# Patient Record
Sex: Female | Born: 1965 | Race: Black or African American | Hispanic: No | Marital: Married | Smoking: Never smoker
Health system: Southern US, Community
[De-identification: ages and names within clinical notes are randomized; demographics above are authoritative.]

## PROBLEM LIST (undated history)

## (undated) DIAGNOSIS — I10 Essential (primary) hypertension: Secondary | ICD-10-CM

## (undated) DIAGNOSIS — N62 Hypertrophy of breast: Secondary | ICD-10-CM

## (undated) DIAGNOSIS — E669 Obesity, unspecified: Secondary | ICD-10-CM

## (undated) DIAGNOSIS — R002 Palpitations: Secondary | ICD-10-CM

## (undated) DIAGNOSIS — L739 Follicular disorder, unspecified: Secondary | ICD-10-CM

## (undated) DIAGNOSIS — B369 Superficial mycosis, unspecified: Secondary | ICD-10-CM

## (undated) DIAGNOSIS — D649 Anemia, unspecified: Secondary | ICD-10-CM

## (undated) DIAGNOSIS — K219 Gastro-esophageal reflux disease without esophagitis: Secondary | ICD-10-CM

## (undated) HISTORY — DX: Obesity, unspecified: E66.9

## (undated) HISTORY — DX: Superficial mycosis, unspecified: B36.9

## (undated) HISTORY — DX: Essential (primary) hypertension: I10

## (undated) HISTORY — DX: Follicular disorder, unspecified: L73.9

## (undated) HISTORY — DX: Palpitations: R00.2

---

## 2002-01-09 ENCOUNTER — Other Ambulatory Visit: Admission: RE | Admit: 2002-01-09 | Discharge: 2002-01-09 | Payer: Self-pay | Admitting: Obstetrics and Gynecology

## 2003-12-19 ENCOUNTER — Emergency Department (HOSPITAL_COMMUNITY): Admission: EM | Admit: 2003-12-19 | Discharge: 2003-12-19 | Payer: Self-pay | Admitting: Emergency Medicine

## 2005-04-26 ENCOUNTER — Ambulatory Visit: Payer: Self-pay | Admitting: Family Medicine

## 2005-06-14 ENCOUNTER — Ambulatory Visit: Payer: Self-pay | Admitting: Family Medicine

## 2006-05-17 ENCOUNTER — Emergency Department (HOSPITAL_COMMUNITY): Admission: EM | Admit: 2006-05-17 | Discharge: 2006-05-17 | Payer: Self-pay | Admitting: Emergency Medicine

## 2007-05-08 ENCOUNTER — Ambulatory Visit (HOSPITAL_COMMUNITY): Admission: RE | Admit: 2007-05-08 | Discharge: 2007-05-08 | Payer: Self-pay | Admitting: Obstetrics and Gynecology

## 2007-10-17 ENCOUNTER — Ambulatory Visit: Payer: Self-pay | Admitting: Family Medicine

## 2007-10-18 ENCOUNTER — Encounter: Payer: Self-pay | Admitting: Family Medicine

## 2007-10-18 ENCOUNTER — Ambulatory Visit (HOSPITAL_COMMUNITY): Admission: RE | Admit: 2007-10-18 | Discharge: 2007-10-18 | Payer: Self-pay | Admitting: Family Medicine

## 2007-10-18 LAB — CONVERTED CEMR LAB
Alkaline Phosphatase: 70 units/L (ref 39–117)
Basophils Absolute: 0 10*3/uL (ref 0.0–0.1)
Bilirubin, Direct: 0.1 mg/dL (ref 0.0–0.3)
CO2: 28 meq/L (ref 19–32)
Chloride: 102 meq/L (ref 96–112)
Eosinophils Absolute: 0.1 10*3/uL — ABNORMAL LOW (ref 0.2–0.7)
HCT: 41 % (ref 36.0–46.0)
Hemoglobin: 13.5 g/dL (ref 12.0–15.0)
LDL Cholesterol: 101 mg/dL — ABNORMAL HIGH (ref 0–99)
Lymphs Abs: 1.5 10*3/uL (ref 0.7–4.0)
MCHC: 32.9 g/dL (ref 30.0–36.0)
Monocytes Absolute: 0.3 10*3/uL (ref 0.1–1.0)
Monocytes Relative: 6 % (ref 3–12)
Platelets: 238 10*3/uL (ref 150–400)
Potassium: 3.8 meq/L (ref 3.5–5.3)
RBC: 4.83 M/uL (ref 3.87–5.11)
Sodium: 142 meq/L (ref 135–145)
Triglycerides: 68 mg/dL (ref <150)
VLDL: 14 mg/dL (ref 0–40)
WBC: 5 10*3/uL (ref 4.0–10.5)

## 2007-10-21 ENCOUNTER — Ambulatory Visit (HOSPITAL_COMMUNITY): Admission: RE | Admit: 2007-10-21 | Discharge: 2007-10-21 | Payer: Self-pay | Admitting: Family Medicine

## 2009-04-11 ENCOUNTER — Ambulatory Visit: Payer: Self-pay | Admitting: Family Medicine

## 2009-04-11 DIAGNOSIS — I10 Essential (primary) hypertension: Secondary | ICD-10-CM

## 2009-04-11 DIAGNOSIS — R079 Chest pain, unspecified: Secondary | ICD-10-CM | POA: Insufficient documentation

## 2009-04-12 ENCOUNTER — Ambulatory Visit: Payer: Self-pay | Admitting: Cardiology

## 2009-04-18 ENCOUNTER — Ambulatory Visit (HOSPITAL_COMMUNITY): Admission: RE | Admit: 2009-04-18 | Discharge: 2009-04-18 | Payer: Self-pay | Admitting: Cardiology

## 2009-04-18 ENCOUNTER — Ambulatory Visit: Payer: Self-pay | Admitting: Cardiology

## 2009-04-18 ENCOUNTER — Encounter: Payer: Self-pay | Admitting: Cardiology

## 2009-05-02 ENCOUNTER — Telehealth (INDEPENDENT_AMBULATORY_CARE_PROVIDER_SITE_OTHER): Payer: Self-pay

## 2010-02-16 ENCOUNTER — Ambulatory Visit: Payer: Self-pay | Admitting: Family Medicine

## 2010-02-21 ENCOUNTER — Encounter: Payer: Self-pay | Admitting: Physician Assistant

## 2010-02-22 LAB — CONVERTED CEMR LAB
ALT: 20 units/L (ref 0–35)
Alkaline Phosphatase: 69 units/L (ref 39–117)
BUN: 16 mg/dL (ref 6–23)
CO2: 29 meq/L (ref 19–32)
Glucose, Bld: 82 mg/dL (ref 70–99)
HCT: 41.1 % (ref 36.0–46.0)
Hemoglobin: 13.6 g/dL (ref 12.0–15.0)
LDL Cholesterol: 116 mg/dL — ABNORMAL HIGH (ref 0–99)
MCHC: 33.1 g/dL (ref 30.0–36.0)
MCV: 84.6 fL (ref 78.0–100.0)
RDW: 12.2 % (ref 11.5–15.5)
TSH: 1.705 microintl units/mL (ref 0.350–4.500)
Total Bilirubin: 0.6 mg/dL (ref 0.3–1.2)
Total Protein: 7.4 g/dL (ref 6.0–8.3)
Triglycerides: 62 mg/dL (ref <150)
VLDL: 12 mg/dL (ref 0–40)
WBC: 4.8 10*3/uL (ref 4.0–10.5)

## 2010-03-21 ENCOUNTER — Ambulatory Visit: Payer: Self-pay | Admitting: Family Medicine

## 2010-03-21 DIAGNOSIS — E559 Vitamin D deficiency, unspecified: Secondary | ICD-10-CM

## 2010-03-21 DIAGNOSIS — R002 Palpitations: Secondary | ICD-10-CM | POA: Insufficient documentation

## 2010-05-25 ENCOUNTER — Encounter: Payer: Self-pay | Admitting: Family Medicine

## 2010-05-25 ENCOUNTER — Other Ambulatory Visit: Admission: RE | Admit: 2010-05-25 | Discharge: 2010-05-25 | Payer: Self-pay | Admitting: Obstetrics and Gynecology

## 2010-05-31 ENCOUNTER — Ambulatory Visit (HOSPITAL_COMMUNITY): Admission: RE | Admit: 2010-05-31 | Discharge: 2010-05-31 | Payer: Self-pay | Admitting: Obstetrics & Gynecology

## 2010-08-09 ENCOUNTER — Ambulatory Visit: Payer: Self-pay | Admitting: Family Medicine

## 2010-08-09 DIAGNOSIS — K3189 Other diseases of stomach and duodenum: Secondary | ICD-10-CM

## 2010-08-09 DIAGNOSIS — R1013 Epigastric pain: Secondary | ICD-10-CM

## 2010-12-17 ENCOUNTER — Encounter: Payer: Self-pay | Admitting: Family Medicine

## 2010-12-17 ENCOUNTER — Encounter: Payer: Self-pay | Admitting: Obstetrics and Gynecology

## 2010-12-26 NOTE — Progress Notes (Signed)
Summary: family tree  family tree   Imported By: Lind Guest 05/26/2010 14:33:48  _____________________________________________________________________  External Attachment:    Type:   Image     Comment:   External Document

## 2010-12-26 NOTE — Assessment & Plan Note (Signed)
Summary: office visit   Vital Signs:  Patient profile:   45 year old female Menstrual status:  regular Height:      66 inches Weight:      210.25 pounds BMI:     34.06 O2 Sat:      97 % Pulse rate:   96 / minute Pulse rhythm:   regular Resp:     16 per minute BP sitting:   144 / 100  (left arm) Cuff size:   large  Vitals Entered By: Everitt Amber LPN (August 09, 2010 3:41 PM)  Nutrition Counseling: Patient's BMI is greater than 25 and therefore counseled on weight management options. CC: Follow up chronic problems, stopped metoprolol and went back to norvasc   CC:  Follow up chronic problems and stopped metoprolol and went back to norvasc.  History of Present Illness: Reports  thatshe has not been doing well. She is concerned about her blood pressure being high, and also because of significant weight gain. She has not been exercising regulalrly, but intends to start. Denies recent fever or chills. Denies sinus pressure, nasal congestion , ear pain or sore throat. Denies chest congestion, or cough productive of sputum. Denies chest pain, palpitations, PND, orthopnea or leg swelling. Denies abdominal pain, nausea, vomitting, diarrhea or constipation. Denies change in bowel movements or bloody stool. Denies dysuria , frequency, incontinence or hesitancy. Denies  joint pain, swelling, or reduced mobility. Denies headaches, vertigo, seizures. Denies depression, anxiety or insomnia. Denies  rash, lesions, or itch.     Current Medications (verified): 1)  Hydrochlorothiazide 25 Mg Tabs (Hydrochlorothiazide) .... Take 1 Tablet By Mouth Once A Day 2)  Vitamin D (Ergocalciferol) 50000 Unit Caps (Ergocalciferol) .... Take 1 Weekly  Allergies (verified): No Known Drug Allergies  Review of Systems      See HPI General:  Complains of fatigue. Eyes:  Denies blurring and discharge. GI:  Complains of bloody stools; sttaes her gynae provider gave stool cards and intends to f/u, she  also reports dyspepsia. Endo:  Denies cold intolerance, excessive thirst, excessive urination, and heat intolerance. Heme:  Denies abnormal bruising and bleeding. Allergy:  Complains of seasonal allergies.  Physical Exam  General:  Well-developed,well-nourished,in no acute distress; alert,appropriate and cooperative throughout examination HEENT: No facial asymmetry,  EOMI, No sinus tenderness, TM's Clear, oropharynx  pink and moist.   Chest: Clear to auscultation bilaterally.  CVS: S1, S2, No murmurs, No S3.   Abd: Soft, Nontender.  MS: Adequate ROM spine, hips, shoulders and knees.  Ext: No edema.   CNS: CN 2-12 intact, power tone and sensation normal throughout.   Skin: Intact, no visible lesions or rashes.  Psych: Good eye contact, normal affect.  Memory intact, not anxious or depressed appearing.    Impression & Recommendations:  Problem # 1:  DYSPEPSIA (ICD-536.8) Assessment Deteriorated  Orders: TLB-H. Pylori Abs(Helicobacter Pylori) ([address][id number])  Problem # 2:  PALPITATIONS (ICD-785.1) Assessment: Improved  The following medications were removed from the medication list:    Metoprolol Succinate 50 Mg Xr24h-tab (Metoprolol succinate) .Marland Kitchen... Take 1 daily for high blood pressure  Problem # 3:  ESSENTIAL HYPERTENSION (ICD-401.9) Assessment: Deteriorated  The following medications were removed from the medication list:    Hydrochlorothiazide 25 Mg Tabs (Hydrochlorothiazide) .Marland Kitchen... Take 1 tablet by mouth once a day    Metoprolol Succinate 50 Mg Xr24h-tab (Metoprolol succinate) .Marland Kitchen... Take 1 daily for high blood pressure Her updated medication list for this problem includes:    Amlodipine Besylate 10  Mg Tabs (Amlodipine besylate) .Marland Kitchen... Take 1 tablet by mouth once a day    Triamterene-hctz 37.5-25 Mg Tabs (Triamterene-hctz) .Marland Kitchen... Take 1 tablet by mouth once a day  Orders: T-Basic Metabolic Panel [phone])  BP today: 144/100 Prior BP: 126/90 (03/21/2010)  Labs  Reviewed: K+: 3.7 (02/21/2010) Creat: : 0.83 (02/21/2010)   Chol: 187 (02/21/2010)   HDL: 59 (02/21/2010)   LDL: 116 (02/21/2010)   TG: 62 (02/21/2010)  Problem # 4:  OBESITY, UNSPECIFIED (ICD-278.00) Assessment: Deteriorated  Ht: 66 (08/09/2010)   Wt: 210.25 (08/09/2010)   BMI: 34.06 (08/09/2010) therapeutic lifestyle change discussed and encouraged  Problem # 5:  UNSPECIFIED VITAMIN D DEFICIENCY (ICD-268.9) Assessment: Comment Only  Orders: T-Vitamin D (25-Hydroxy) ([id number])  Complete Medication List: 1)  Vitamin D (ergocalciferol) 50000 Unit Caps (Ergocalciferol) .... Take 1 weekly 2)  Amlodipine Besylate 10 Mg Tabs (Amlodipine besylate) .... Take 1 tablet by mouth once a day 3)  Triamterene-hctz 37.5-25 Mg Tabs (Triamterene-hctz) .... Take 1 tablet by mouth once a day  Patient Instructions: 1)  F/U in 6 weeks. 2)  It is important that you exercise regularly at least 30 minutes 5 times a week. If you develop chest pain, have severe difficulty breathing, or feel very tired , stop exercising immediately and seek medical attention. 3)  You need to lose weight. Consider a lower calorie diet and regular exercise. We will you the DASH diet.Goal is 3 pounds. 4)  New meds for BP as discussed. 5)  Vit D level and aH pylori test today. 6)  I suggest youn get upper endoscopy to eval stomach pain with blood in the stool as qwell as a colonscopy, pls discuss firther with gynae Prescriptions: TRIAMTERENE-HCTZ 37.5-25 MG TABS (TRIAMTERENE-HCTZ) Take 1 tablet by mouth once a day  #30 x 1   Entered and Authorized by:   Syliva Overman MD   Signed by:   Syliva Overman MD on 08/09/2010   Method used:   Electronically to        [facility] [address] [phone]* (retail)       [address] [place], [place]  [address]       Ph: [phone]       Fax: [phone]   RxID:   [id number] AMLODIPINE BESYLATE 10 MG TABS (AMLODIPINE BESYLATE) Take 1 tablet by mouth once a day  #30 x 2    Entered and Authorized by:   Syliva Overman MD   Signed by:   Syliva Overman MD on 08/09/2010   Method used:   Electronically to        [facility] [address] [phone]* (retail)       [address] [place]  [address]       Ph: [phone]       Fax: [phone]   RxID:   [id number]

## 2010-12-26 NOTE — Assessment & Plan Note (Signed)
Summary: BLOOD PRESSURE- ROOM 2   Vital Signs:  Patient profile:   45 year old female Menstrual status:  regular Height:      66 inches Weight:      200.75 pounds BMI:     32.52 O2 Sat:      99 % on Room air Pulse rate:   79 / minute Resp:     16 per minute BP sitting:   160 / 100  (left arm)  Vitals Entered By: Adella Hare LPN (February 16, 2010 10:58 AM) CC: BLOOD PRESSURE Is Patient Diabetic? No Pain Assessment Patient in pain? no      Comments PATIENT HASNT TAKEN ANY MEDS IN OVER A MONTH   CC:  BLOOD PRESSURE.  History of Present Illness: Hx of htn. Ran out of BP meds approx 1 mos ago.  No probs with meds. On BP meds, BP readings at home approx 130/ 80 something.  Off meds 160's / 90's  No headache. Occ has irrg heart beat.  No chest pain Had recent cardiac eval & neg echo. No difficulty breathing.   Current Medications (verified): 1)  Norvasc 5 Mg Tabs (Amlodipine Besylate) .... Take 1 Tablet By Mouth Once A Day 2)  Hydrochlorothiazide 25 Mg Tabs (Hydrochlorothiazide) .... Take 1 Tablet By Mouth Once A Day  Allergies (verified): No Known Drug Allergies  Past History:  Past medical history reviewed for relevance to current acute and chronic problems.  Past Medical History: Reviewed history from 04/11/2009 and no changes required. HYPERTENSION x 10 years  Review of Systems CV:  Complains of palpitations; denies chest pain or discomfort and swelling of feet. Resp:  Denies shortness of breath.  Physical Exam  General:  Well-developed,well-nourished,in no acute distress; alert,appropriate and cooperative throughout examination Head:  Normocephalic and atraumatic without obvious abnormalities. No apparent alopecia or balding. Eyes:  No corneal or conjunctival inflammation noted. EOMI. Perrla. Funduscopic exam benign, without hemorrhages, exudates or papilledema. Ears:  External ear exam shows no significant lesions or deformities.  Otoscopic examination  reveals clear canals, tympanic membranes are intact bilaterally without bulging, retraction, inflammation or discharge. Hearing is grossly normal bilaterally. Nose:  External nasal examination shows no deformity or inflammation. Nasal mucosa are pink and moist without lesions or exudates. Mouth:  Oral mucosa and oropharynx without lesions or exudates.  Teeth in good repair. Neck:  No deformities, masses, or tenderness noted. Lungs:  Normal respiratory effort, chest expands symmetrically. Lungs are clear to auscultation, no crackles or wheezes. Heart:  Normal rate and regular rhythm. S1 and S2 normal without gallop, murmur, click, rub or other extra sounds. Cervical Nodes:  No lymphadenopathy noted Psych:  Cognition and judgment appear intact. Alert and cooperative with normal attention span and concentration. No apparent delusions, illusions, hallucinations   Impression & Recommendations:  Problem # 1:  ESSENTIAL HYPERTENSION (ICD-401.9) Assessment Deteriorated  Her updated medication list for this problem includes:    Norvasc 5 Mg Tabs (Amlodipine besylate) .Marland Kitchen... Take 1 tablet by mouth once a day    Hydrochlorothiazide 25 Mg Tabs (Hydrochlorothiazide) .Marland Kitchen... Take 1 tablet by mouth once a day  Orders: T-Comprehensive Metabolic Panel [phone]) T-Lipid Profile ([id number])  Complete Medication List: 1)  Norvasc 5 Mg Tabs (Amlodipine besylate) .... Take 1 tablet by mouth once a day 2)  Hydrochlorothiazide 25 Mg Tabs (Hydrochlorothiazide) .... Take 1 tablet by mouth once a day  Other Orders: T-CBC No Diff ([id number]) T-TSH [phone]) T-Vitamin D (25-Hydroxy) [phone])  Patient Instructions: 1)  Please  schedule a follow-up appointment in 1 month. 2)  It is important that you exercise regularly at least 20 minutes 5 times a week. If you develop chest pain, have severe difficulty breathing, or feel very tired , stop exercising immediately and seek medical attention. 3)  You  need to lose weight. Consider a lower calorie diet and regular exercise.  4)  Check your Blood Pressure regularly.  5)  I ahve ordered blood work.  Have this drawn fasting. 6)  Restart you BP meds.  I have sent refills to the pharmacy. Prescriptions: HYDROCHLOROTHIAZIDE 25 MG TABS (HYDROCHLOROTHIAZIDE) Take 1 tablet by mouth once a day  #30 x 1   Entered and Authorized by:   Esperanza Sheets PA   Signed by:   Esperanza Sheets PA on 02/16/2010   Method used:   Electronically to        [facility] [phone]* (retail)       [address] [place], [place]  [address]       Ph: [phone]       Fax: [phone]   RxID:   [id number] NORVASC 5 MG TABS (AMLODIPINE BESYLATE) Take 1 tablet by mouth once a day  #30 x 1   Entered and Authorized by:   Esperanza Sheets PA   Signed by:   Esperanza Sheets PA on 02/16/2010   Method used:   Electronically to        [facility] [address] [phone]* (retail)       [address] [place]  [address]       Ph: [phone]       Fax: [phone]   RxID:   [id number]

## 2010-12-26 NOTE — Assessment & Plan Note (Signed)
Summary: bp follow up- room 1   Vital Signs:  Patient profile:   45 year old female Menstrual status:  regular Height:      66 inches Weight:      205 pounds BMI:     33.21 O2 Sat:      98 % on Room air Pulse rate:   89 / minute Resp:     16 per minute BP sitting:   126 / 90  (left arm)  Vitals Entered By: Adella Hare LPN (Nahdia 26, 2011 8:45 AM)  Nutrition Counseling: Patient's BMI is greater than 25 and therefore counseled on weight management options.  Serial Vital Signs/Assessments:  Time      Position  BP       Pulse  Resp  Temp     By                     118/82                         Esperanza Sheets PA  CC: bp follow up Is Patient Diabetic? No Pain Assessment Patient in pain? no        CC:  bp follow up.  History of Present Illness: Pt is here today for f/u of her htn. She has restarted her BP meds & is doing well with them.  No swelling. She states that she is still having some palpitations though.  She notices this mostly after she eats.  Lasts for awhile.  Has some chest discomfort during exercise still too.  Cardiac eval is negative. Occ heartburn, but not weekly.  She is due for her yrly pelvic/breast exam with GYN.  She will schedule this.  Also had labs done recently.  She is taking her prescription Vit D. Reviewed labs with pt today.   Current Medications (verified): 1)  Norvasc 5 Mg Tabs (Amlodipine Besylate) .... Take 1 Tablet By Mouth Once A Day 2)  Hydrochlorothiazide 25 Mg Tabs (Hydrochlorothiazide) .... Take 1 Tablet By Mouth Once A Day 3)  Vitamin D (Ergocalciferol) 50000 Unit Caps (Ergocalciferol) .... Take 1 Weekly  Allergies (verified): No Known Drug Allergies  Past History:  Past Medical History: HYPERTENSION  VIT D DEFIENCY OBESTIY PALPITATIONS PMH reviewed for relevance  Review of Systems CV:  Complains of chest pain or discomfort and palpitations; denies difficulty breathing at night, difficulty breathing while lying down,  lightheadness, and swelling of feet. Resp:  Denies cough and shortness of breath. GI:  Complains of indigestion; denies change in bowel habits, nausea, and vomiting.  Physical Exam  General:  Well-developed,well-nourished,in no acute distress; alert,appropriate and cooperative throughout examination Head:  Normocephalic and atraumatic without obvious abnormalities. No apparent alopecia or balding. Ears:  External ear exam shows no significant lesions or deformities.  Otoscopic examination reveals clear canals, tympanic membranes are intact bilaterally without bulging, retraction, inflammation or discharge. Hearing is grossly normal bilaterally. Nose:  External nasal examination shows no deformity or inflammation. Nasal mucosa are pink and moist without lesions or exudates. Mouth:  Oral mucosa and oropharynx without lesions or exudates.  Teeth in good repair. Neck:  No deformities, masses, or tenderness noted. Lungs:  Normal respiratory effort, chest expands symmetrically. Lungs are clear to auscultation, no crackles or wheezes. Heart:  Normal rate and regular rhythm. S1 and S2 normal without gallop, murmur, click, rub or other extra sounds. Extremities:  No PTE bilat Cervical Nodes:  No lymphadenopathy noted Psych:  Cognition and judgment appear intact. Alert and cooperative with normal attention span and concentration. No apparent delusions, illusions, hallucinations   Impression & Recommendations:  Problem # 1:  ESSENTIAL HYPERTENSION (ICD-401.9) Assessment Improved DASH diet h/o given. Pt to monitor BP at home due to change in BP meds.  If BP increases/not well controlled to come back sooner than 3 mos.  The following medications were removed from the medication list:    Norvasc 5 Mg Tabs (Amlodipine besylate) .Marland Kitchen... Take 1 tablet by mouth once a day Her updated medication list for this problem includes:    Hydrochlorothiazide 25 Mg Tabs (Hydrochlorothiazide) .Marland Kitchen... Take 1 tablet by  mouth once a day    Metoprolol Succinate 50 Mg Xr24h-tab (Metoprolol succinate) .Marland Kitchen... Take 1 daily for high blood pressure  BP today: 126/90, recheck 118/84 Prior BP: 160/100 (02/16/2010)  Labs Reviewed: K+: 3.7 (02/21/2010) Creat: : 0.83 (02/21/2010)   Chol: 187 (02/21/2010)   HDL: 59 (02/21/2010)   LDL: 116 (02/21/2010)   TG: 62 (02/21/2010)  Problem # 2:  PALPITATIONS (ICD-785.1) Assessment: Unchanged  Her updated medication list for this problem includes:    Metoprolol Succinate 50 Mg Xr24h-tab (Metoprolol succinate) .Marland Kitchen... Take 1 daily for high blood pressure  Problem # 3:  OBESITY, UNSPECIFIED (ICD-278.00) Assessment: Deteriorated Encouraged healthy diet, exercise, & wt loss. DASH diet h/o given.  Advised pt will help with HTN but also wt loss.  Problem # 4:  UNSPECIFIED VITAMIN D DEFICIENCY (ICD-268.9) Assessment: New Continue Vit D weekly. Will recheck Vit D level in approx 3 mos.  Orders: T-Vitamin D (25-Hydroxy) [phone])  Complete Medication List: 1)  Hydrochlorothiazide 25 Mg Tabs (Hydrochlorothiazide) .... Take 1 tablet by mouth once a day 2)  Vitamin D (ergocalciferol) 50000 Unit Caps (Ergocalciferol) .... Take 1 weekly 3)  Metoprolol Succinate 50 Mg Xr24h-tab (Metoprolol succinate) .... Take 1 daily for high blood pressure  Patient Instructions: 1)  Please schedule a follow-up appointment in 3 months. 2)  It is important that you exercise regularly at least 20 minutes 5 times a week. If you develop chest pain, have severe difficulty breathing, or feel very tired , stop exercising immediately and seek medical attention. 3)  You need to lose weight. Consider a lower calorie diet and regular exercise.  4)  Have your vitamin D level checked again in approx 3 mos. 5)  Discontinue Norvasc. 6)  I have prescribed Toprol XL in place of Norvasc. 7)  Check your Blood Pressure regularly. If it is above 160/100: you should make an  appointment. Prescriptions: HYDROCHLOROTHIAZIDE 25 MG TABS (HYDROCHLOROTHIAZIDE) Take 1 tablet by mouth once a day  #30 x 3   Entered and Authorized by:   Esperanza Sheets PA   Signed by:   Esperanza Sheets PA on 03/21/2010   Method used:   Electronically to        [facility] [phone]* (retail)       [address] [place], [place]  [address]       Ph: [phone]       Fax: [phone]   RxID:   [id number] METOPROLOL SUCCINATE 50 MG XR24H-TAB (METOPROLOL SUCCINATE) take 1 daily for high blood pressure  #30 x 3   Entered and Authorized by:   Esperanza Sheets PA   Signed by:   Esperanza Sheets PA on 03/21/2010   Method used:   Electronically to        [facility] [address] [phone]* (  retail)       [address], [place]  [address]       Ph: [phone]       Fax: [phone]   RxID:   [id number]

## 2011-03-01 ENCOUNTER — Other Ambulatory Visit: Payer: Self-pay | Admitting: Family Medicine

## 2011-03-02 LAB — BASIC METABOLIC PANEL
Calcium: 9.1 mg/dL (ref 8.4–10.5)
Creat: 0.96 mg/dL (ref 0.40–1.20)
Glucose, Bld: 94 mg/dL (ref 70–99)
Potassium: 3.7 mEq/L (ref 3.5–5.3)
Sodium: 142 mEq/L (ref 135–145)

## 2011-03-02 LAB — H. PYLORI ANTIBODY, IGG: H Pylori IgG: 0.45 {ISR}

## 2011-03-05 ENCOUNTER — Encounter: Payer: Self-pay | Admitting: Family Medicine

## 2011-03-05 ENCOUNTER — Ambulatory Visit (INDEPENDENT_AMBULATORY_CARE_PROVIDER_SITE_OTHER): Payer: BLUE CROSS/BLUE SHIELD | Admitting: Family Medicine

## 2011-03-05 VITALS — BP 148/100 | HR 84 | Resp 16 | Ht 65.0 in | Wt 208.0 lb

## 2011-03-05 DIAGNOSIS — R7301 Impaired fasting glucose: Secondary | ICD-10-CM

## 2011-03-05 DIAGNOSIS — Z13820 Encounter for screening for osteoporosis: Secondary | ICD-10-CM

## 2011-03-05 DIAGNOSIS — R5383 Other fatigue: Secondary | ICD-10-CM

## 2011-03-05 DIAGNOSIS — R5381 Other malaise: Secondary | ICD-10-CM

## 2011-03-05 DIAGNOSIS — E785 Hyperlipidemia, unspecified: Secondary | ICD-10-CM

## 2011-03-05 DIAGNOSIS — E669 Obesity, unspecified: Secondary | ICD-10-CM

## 2011-03-05 DIAGNOSIS — I10 Essential (primary) hypertension: Secondary | ICD-10-CM

## 2011-03-05 MED ORDER — TRIAMTERENE-HCTZ 37.5-25 MG PO TABS: 1.0000 | ORAL_TABLET | Freq: Every day | ORAL | Status: DC

## 2011-03-05 MED ORDER — AMLODIPINE BESYLATE 10 MG PO TABS: 10.0000 mg | ORAL_TABLET | Freq: Every day | ORAL | Status: DC

## 2011-03-05 NOTE — Patient Instructions (Addendum)
F/u in 4 months.  A healthy diet is rich in fruit, vegetables and whole grains. Poultry fish, nuts and beans are a healthy choice for protein rather then red meat. A low sodium diet and drinking 64 ounces of water daily is generally recommended. Oils and sweet should be limited. Carbohydrates especially for those who are diabetic or overweight, should be limited to 34-45 gram per meal. It is important to eat on a regular schedule, at least 3 times daily. Snacks should be primarily fruits, vegetables or nuts.  Fasting lipid, chem 7 , hBA1C and Vitamin D  In 4 months. Weight loss goal is approx 12 pounds in the next 4 months.  The lesion in your right armpit appears to be a nodule/cyst, just under the skin. Pls continue to check on it, and if further concern, pls call I will have a surgeon evaluate  Plstake one multivitamin once daily, also vit D (OTC) 1000IU once daily

## 2011-03-12 ENCOUNTER — Encounter: Payer: Self-pay | Admitting: Family Medicine

## 2011-03-12 NOTE — Progress Notes (Signed)
  Subjective:    Patient ID: Diamond Le, female    DOB: 1966/02/03, 45 y.o.   MRN: [id number]  HPI The PT is here for follow up and re-evaluation of chronic medical conditions, medication management and review of recent lab and radiology data.  Preventive health is updated, specifically  Cancer screening,  and Immunization.   Ms. Diamond Le denies any adverse reactions to current medications since the last visit.  She has actually been out of her medication for the last 2 weeks, unfortunately. There are no new concerns.  There are no specific complaints . She has not been diligent with lifestyle change and has gained weight.      Review of Systems Denies recent fever or chills. Denies sinus pressure, nasal congestion, ear pain or sore throat. Denies chest congestion, productive cough or wheezing. Denies chest pains, palpitations, paroxysmal nocturnal dyspnea, orthopnea and leg swelling Denies abdominal pain, nausea, vomiting,diarrhea or constipation.  Denies rectal bleeding or change in bowel movement. Denies dysuria, frequency, hesitancy or incontinence. Denies joint pain, swelling and limitation and mobility. Denies headaches, seizure, numbness, or tingling. Denies depression, anxiety or insomnia. Denies skin break down or rash.She is concerned about a painless swelling in her right armpit which she has noted in the past approx 3 weeks        Objective:   Physical Exam   Patient alert and oriented and in no Cardiopulmonary distress.  HEENT: No facial asymmetry, EOMI, no sinus tenderness,  Oropharynx pink and moist.  Neck supple no adenopathy.  Chest: Clear to auscultation bilaterally.  CVS: S1, S2 no murmurs, no S3.  ABD: Soft non tender. Bowel sounds normal.  Ext: No edema  MS: Adequate ROM spine, shoulders, hips and knees.  Skin: Intact, no ulcerations or rash noted.Subcutaneous nodule /cyst in ight axilla, no mass or adenopathy appreciated  Psych: Good eye  contact, normal affect. Memory intact not anxious or depressed appearing.  CNS: CN 2-12 intact, power, tone and sensation normal throughout.     Assessment & Plan:  1. Hypertension : uncontrolled. The importance of medication compliance was stressed. 2. Obesity : deteriorated. Pt is commiting to regular exercise and dietary change to promote weight loss. 3. Hyperlipidemia: Hyperlipidemia:Low fat diet discussed and encouraged. Labs to be checked

## 2011-04-10 NOTE — Assessment & Plan Note (Signed)
[facility]                       [facility] OFFICE NOTE   NAME:Diamond [name]                       MRN:          [id number]  DATE:04/12/2009                            DOB:          [birth year]    CHIEF COMPLAINT:  Chest pain.   I was asked by Dr. Syliva Overman to evaluate Diamond [name], a  delightful 45 year old African American female with new onset chest  pain.   She has noted some well localized left parasternal chest discomfort with  and without exertion over the past couple of weeks.  She describes a  dull ache, but also throbbing and sometimes sharp.  It does not radiate.   She has had no concomitant nausea, vomiting, diaphoresis, shortness of  breath, syncope or presyncope.  She has had no tachy palpitations.   She denies any orthopnea, PND, or peripheral edema.  She has had no  fever, chills, sweats, difficulty swallowing, early satiety, peripheral  edema.   She has no previous cardiac history, but she does have a diagnosis of  hypertension, which has been harder to control.  She is also overweight  and fairly sedentary.   PAST MEDICAL HISTORY:  She has had no previous hospitalizations.  She  has had no surgeries.  She has no known drug allergies.  She does not  smoke or drink.   She has no history of diabetes or hyperlipidemia.   CURRENT MEDICATIONS:  Amlodipine 5 mg a day, hydrochlorothiazide 25 mg  per day.   She is due for blood work next week with Dr. Lodema Hong.   SOCIAL HISTORY:  Works at [employer].  She is married and has two children.   FAMILY HISTORY:  Negative for premature coronary artery disease.   REVIEW OF SYSTEMS:  Negative other than the HPI.  All systems were  questioned.  Please refer our diagnostic evaluation form.   PHYSICAL EXAMINATION:  GENERAL:  She is very pleasant, overweight black  female in no acute distress.  VITAL SIGNS:  Her blood pressure is 160/110 in the right arm, her pulse  is 81  and regular.  This has been under better control until she walked  in the office.  She is 5 feet 6 inches, weighs 200 pounds.  SKIN:  Warm and dry.  She has some abdominal striae.  HEENT:  Normal.  NECK:  Supple.  Carotid upstrokes were equal bilaterally without bruits.  There is no thyroid enlargement or tenderness.  Trachea is midline.  No  JVD.  Carotids are full without bruits.  CHEST:  Lungs are clear to auscultation and percussion.  HEART:  Nondisplaced PMI, normal S1 and S2.  No murmur, rub, or click.  ABDOMEN:  Soft, good bowel sounds.  No midline bruit.  There is no  obvious organomegaly.  EXTREMITIES:  There is no cyanosis, clubbing, or edema.  Pulses were  2+/4+ bilaterally symmetrical.  There is no sign of DVT.  NEUROLOGIC:  Intact.   EKG is normal.   ASSESSMENT:  1. Substernal precordial chest discomfort, doubt cardiac.  2. Hypertension.  Probably labile and perhaps poorly controlled.  3.  Obesity.  4. Sedentary lifestyle.   PLAN:  1. A 2D echocardiogram to rule out any significant LVH, mitral valve      prolapse, which I doubt, and also to evaluate LV and right      ventricular function.  I will also make sure she does not have a      pericardial effusion, though this is doubtful as well.  2. Followup blood pressure control with Dr. Lodema Hong.  3. Weight loss.  4. Three hours of exercise per week.  5. Follow blood work including lipid panel with Dr. Lodema Hong.     Thomas C. Daleen Squibb, MD, [facility]  Electronically Signed    TCW/MedQ  DD: 04/12/2009  DT: 04/13/2009  Job #: [id number]   cc:   Milus Mallick. Lodema Hong, M.D.

## 2011-08-29 ENCOUNTER — Other Ambulatory Visit: Payer: Self-pay | Admitting: Family Medicine

## 2011-08-29 DIAGNOSIS — Z139 Encounter for screening, unspecified: Secondary | ICD-10-CM

## 2011-09-14 ENCOUNTER — Ambulatory Visit (HOSPITAL_COMMUNITY): Admission: RE | Admit: 2011-09-14 | Payer: BC Managed Care – PPO | Source: Ambulatory Visit

## 2011-09-28 ENCOUNTER — Ambulatory Visit (HOSPITAL_COMMUNITY): Payer: BC Managed Care – PPO

## 2011-10-31 NOTE — Progress Notes (Signed)
Addended by: Abner Greenspan on: 10/31/2011 02:45 PM   Modules accepted: Orders

## 2011-12-19 ENCOUNTER — Encounter: Payer: Self-pay | Admitting: Family Medicine

## 2011-12-21 ENCOUNTER — Encounter: Payer: Self-pay | Admitting: Family Medicine

## 2011-12-24 ENCOUNTER — Ambulatory Visit: Payer: BC Managed Care – PPO | Admitting: Family Medicine

## 2011-12-31 ENCOUNTER — Encounter: Payer: Self-pay | Admitting: Family Medicine

## 2012-01-01 LAB — CBC WITH DIFFERENTIAL/PLATELET
Basophils Absolute: 0 10*3/uL (ref 0.0–0.1)
HCT: 39.9 % (ref 36.0–46.0)
Hemoglobin: 13.2 g/dL (ref 12.0–15.0)
Lymphocytes Relative: 33 % (ref 12–46)
Lymphs Abs: 1.7 10*3/uL (ref 0.7–4.0)
MCH: 28.5 pg (ref 26.0–34.0)
MCHC: 33.1 g/dL (ref 30.0–36.0)
Monocytes Absolute: 0.3 10*3/uL (ref 0.1–1.0)
Neutro Abs: 3 10*3/uL (ref 1.7–7.7)
Neutrophils Relative %: 60 % (ref 43–77)
RBC: 4.63 MIL/uL (ref 3.87–5.11)
WBC: 5 10*3/uL (ref 4.0–10.5)

## 2012-01-01 LAB — BASIC METABOLIC PANEL
Calcium: 9.4 mg/dL (ref 8.4–10.5)
Potassium: 4.5 mEq/L (ref 3.5–5.3)

## 2012-01-01 LAB — LIPID PANEL
Cholesterol: 173 mg/dL (ref 0–200)
HDL: 52 mg/dL (ref >39)
Total CHOL/HDL Ratio: 3.3 Ratio
Triglycerides: 47 mg/dL (ref <150)

## 2012-01-02 ENCOUNTER — Encounter: Payer: Self-pay | Admitting: Family Medicine

## 2012-01-02 ENCOUNTER — Ambulatory Visit (INDEPENDENT_AMBULATORY_CARE_PROVIDER_SITE_OTHER): Payer: Self-pay | Admitting: Family Medicine

## 2012-01-02 VITALS — BP 150/94 | HR 87 | Resp 16 | Ht 65.0 in | Wt 199.0 lb

## 2012-01-02 DIAGNOSIS — I10 Essential (primary) hypertension: Secondary | ICD-10-CM

## 2012-01-02 DIAGNOSIS — E669 Obesity, unspecified: Secondary | ICD-10-CM

## 2012-01-02 DIAGNOSIS — E785 Hyperlipidemia, unspecified: Secondary | ICD-10-CM

## 2012-01-02 MED ORDER — AMLODIPINE BESYLATE 10 MG PO TABS: 10.0000 mg | ORAL_TABLET | Freq: Every day | ORAL | Status: DC

## 2012-01-02 NOTE — Assessment & Plan Note (Signed)
Improved. Pt applauded on succesful weight loss through lifestyle change, and encouraged to continue same. Weight loss goal set for the next several months. Goal of 6 to 8 pound weight loss in 4 month

## 2012-01-02 NOTE — Assessment & Plan Note (Signed)
Uncontrolled, out of meds x 1 month

## 2012-01-02 NOTE — Patient Instructions (Signed)
F/u in 4 month  Resume amlodipine one daily.  LOWER dose of maxzide HALF daily, although the prescription still says one daily.  Follow the DASH diet, which we will give you, and try to eat  1500 cals daily  Get sweets out of your home so you will not be tempted!  Weight loss goal of 8 to 10 pounds in the next 4 months. Bad cholesterol slightly high, cut back on Nabs and snacks like that [pls  It is important that you exercise regularly at least 30 minutes 5 times a week. If you develop chest pain, have severe difficulty breathing, or feel very tired, stop exercising immediately and seek medical attention    You Need to sched mammogram , past due

## 2012-01-06 DIAGNOSIS — E785 Hyperlipidemia, unspecified: Secondary | ICD-10-CM | POA: Insufficient documentation

## 2012-01-06 NOTE — Progress Notes (Signed)
  Subjective:    Patient ID: Diamond Le, female    DOB: 1966-01-24, 46 y.o.   MRN: [id number]  HPI The PT is here for follow up and re-evaluation of chronic medical conditions, medication management and review of any available recent lab and radiology data.  Preventive health is updated, specifically  Cancer screening and Immunization.   Questions or concerns regarding consultations or procedures which the PT has had in the interim are  addressed. The PT denies any adverse reactions to current medications since the last visit.  There are no new concerns.  There are no specific complaints       Review of Systems Denies recent fever or chills. Denies sinus pressure, nasal congestion, ear pain or sore throat. Denies chest congestion, productive cough or wheezing. Denies chest pains, palpitations and leg swelling Denies abdominal pain, nausea, vomiting,diarrhea or constipation.   Denies dysuria, frequency, hesitancy or incontinence. Denies joint pain, swelling and limitation in mobility. Denies headaches, seizures, numbness, or tingling. Denies depression, anxiety or insomnia. Denies skin break down or rash.        Objective:   Physical Exam Patient alert and oriented and in no cardiopulmonary distress.  HEENT: No facial asymmetry, EOMI, no sinus tenderness,  oropharynx pink and moist.  Neck supple no adenopathy.  Chest: Clear to auscultation bilaterally.  CVS: S1, S2 no murmurs, no S3.  ABD: Soft non tender. Bowel sounds normal.  Ext: No edema  MS: Adequate ROM spine, shoulders, hips and knees.  Skin: Intact, no ulcerations or rash noted.  Psych: Good eye contact, normal affect. Memory intact not anxious or depressed appearing.  CNS: CN 2-12 intact, power, tone and sensation normal throughout.        Assessment & Plan:

## 2012-01-06 NOTE — Assessment & Plan Note (Signed)
Low fat diet discussed and encouraged 

## 2012-02-07 ENCOUNTER — Inpatient Hospital Stay (HOSPITAL_COMMUNITY): Admission: RE | Admit: 2012-02-07 | Payer: Self-pay | Source: Ambulatory Visit

## 2012-02-08 ENCOUNTER — Ambulatory Visit (HOSPITAL_COMMUNITY)
Admission: RE | Admit: 2012-02-08 | Discharge: 2012-02-08 | Disposition: A | Discharge status: Home / Self Care | Payer: BC Managed Care – PPO | Source: Ambulatory Visit | Attending: Family Medicine | Admitting: Family Medicine

## 2012-02-08 DIAGNOSIS — Z139 Encounter for screening, unspecified: Secondary | ICD-10-CM

## 2012-02-08 DIAGNOSIS — Z1231 Encounter for screening mammogram for malignant neoplasm of breast: Secondary | ICD-10-CM | POA: Insufficient documentation

## 2012-02-20 ENCOUNTER — Ambulatory Visit: Payer: BC Managed Care – PPO | Admitting: Family Medicine

## 2012-03-06 ENCOUNTER — Other Ambulatory Visit: Payer: Self-pay | Admitting: Family Medicine

## 2013-01-12 ENCOUNTER — Other Ambulatory Visit: Payer: Self-pay | Admitting: Family Medicine

## 2013-01-14 ENCOUNTER — Other Ambulatory Visit: Payer: Self-pay

## 2013-01-14 MED ORDER — AMLODIPINE BESYLATE 10 MG PO TABS: ORAL_TABLET | ORAL | Status: DC

## 2013-02-09 ENCOUNTER — Telehealth: Payer: Self-pay | Admitting: Family Medicine

## 2013-02-09 NOTE — Telephone Encounter (Signed)
Lab order faxed to lab. 

## 2013-05-20 ENCOUNTER — Other Ambulatory Visit: Payer: Self-pay

## 2013-05-20 DIAGNOSIS — Z1231 Encounter for screening mammogram for malignant neoplasm of breast: Secondary | ICD-10-CM

## 2013-05-25 ENCOUNTER — Ambulatory Visit: Payer: BC Managed Care – PPO

## 2013-06-18 ENCOUNTER — Ambulatory Visit: Payer: BC Managed Care – PPO

## 2013-07-07 ENCOUNTER — Ambulatory Visit: Payer: BC Managed Care – PPO

## 2013-07-07 ENCOUNTER — Other Ambulatory Visit: Payer: Self-pay | Admitting: Family Medicine

## 2013-07-07 DIAGNOSIS — Z139 Encounter for screening, unspecified: Secondary | ICD-10-CM

## 2013-07-13 ENCOUNTER — Inpatient Hospital Stay (HOSPITAL_COMMUNITY): Admission: RE | Admit: 2013-07-13 | Payer: BC Managed Care – PPO | Source: Ambulatory Visit

## 2013-07-15 ENCOUNTER — Other Ambulatory Visit: Payer: Self-pay | Admitting: Adult Health

## 2013-07-21 ENCOUNTER — Ambulatory Visit: Payer: BC Managed Care – PPO | Admitting: Family Medicine

## 2013-07-21 ENCOUNTER — Ambulatory Visit (HOSPITAL_COMMUNITY)
Admission: RE | Admit: 2013-07-21 | Discharge: 2013-07-21 | Disposition: A | Discharge status: Home / Self Care | Payer: BC Managed Care – PPO | Source: Ambulatory Visit | Attending: Family Medicine | Admitting: Family Medicine

## 2013-07-21 DIAGNOSIS — Z139 Encounter for screening, unspecified: Secondary | ICD-10-CM

## 2013-07-21 DIAGNOSIS — Z1231 Encounter for screening mammogram for malignant neoplasm of breast: Secondary | ICD-10-CM | POA: Insufficient documentation

## 2013-08-06 ENCOUNTER — Encounter: Payer: Self-pay | Admitting: Family Medicine

## 2013-08-06 ENCOUNTER — Ambulatory Visit (INDEPENDENT_AMBULATORY_CARE_PROVIDER_SITE_OTHER): Payer: BC Managed Care – PPO | Admitting: Family Medicine

## 2013-08-06 VITALS — BP 142/92 | HR 84 | Resp 16 | Ht 65.0 in | Wt 204.4 lb

## 2013-08-06 DIAGNOSIS — R7302 Impaired glucose tolerance (oral): Secondary | ICD-10-CM

## 2013-08-06 DIAGNOSIS — R7309 Other abnormal glucose: Secondary | ICD-10-CM

## 2013-08-06 DIAGNOSIS — I10 Essential (primary) hypertension: Secondary | ICD-10-CM

## 2013-08-06 DIAGNOSIS — E785 Hyperlipidemia, unspecified: Secondary | ICD-10-CM

## 2013-08-06 DIAGNOSIS — E559 Vitamin D deficiency, unspecified: Secondary | ICD-10-CM

## 2013-08-06 DIAGNOSIS — E669 Obesity, unspecified: Secondary | ICD-10-CM

## 2013-08-06 MED ORDER — TRIAMTERENE-HCTZ 37.5-25 MG PO TABS: 1.0000 | ORAL_TABLET | Freq: Every day | ORAL | Status: DC

## 2013-08-06 NOTE — Patient Instructions (Addendum)
F/u in 3 month call if you need me before  Fasting lipid, chem7 , HBA1C , TSH vit D and CBC   It is important that you exercise regularly at least 30 minutes 5 times a week. If you develop chest pain, have severe difficulty breathing, or feel very tired, stop exercising immediately and seek medical attention    A healthy diet is rich in fruit, vegetables and whole grains. Poultry fish, nuts and beans are a healthy choice for protein rather then red meat. A low sodium diet and drinking 64 ounces of water daily is generally recommended. Oils and sweet should be limited. Carbohydrates especially for those who are diabetic or overweight, should be limited to 45 to 60 gram per meal. It is important to eat on a regular schedule, at least 3 times daily. Snacks should be primarily fruits, vegetables or nut  Start additional medication maxzide HALF tablet once daily for blood pressure  Weioght loss goal of 2 to 3 pounds per month

## 2013-08-06 NOTE — Progress Notes (Signed)
  Subjective:    Patient ID: Diamond Le, female    DOB: [birth year], 47 y.o.   MRN: [id number]  HPI  The PT is here for follow up and re-evaluation of chronic medical conditions, medication management and review of any available recent lab and radiology data.  Preventive health is updated, specifically  Cancer screening and Immunization.    The PT denies any adverse reactions to current medications since the last visit. Discontinued maxzide and blood pressure is uncontrolled, understands the need to resume  There are no new concerns, except need for weight loss, she has started exercising and is working on dietary change but is disappointed about her weight  There are no specific complaints      Review of Systems See HPI Denies recent fever or chills. Denies sinus pressure, nasal congestion, ear pain or sore throat. Denies chest congestion, productive cough or wheezing. Denies chest pains, palpitations and leg swelling Denies abdominal pain, nausea, vomiting,diarrhea or constipation.   Denies dysuria, frequency, hesitancy or incontinence. Denies joint pain, swelling and limitation in mobility. Denies headaches, seizures, numbness, or tingling. Denies depression, anxiety or insomnia. Denies skin break down or rash.        Objective:   Physical Exam  Patient alert and oriented and in no cardiopulmonary distress.  HEENT: No facial asymmetry, EOMI, no sinus tenderness,  oropharynx pink and moist.  Neck supple no adenopathy.  Chest: Clear to auscultation bilaterally.  CVS: S1, S2 no murmurs, no S3.  ABD: Soft non tender. Bowel sounds normal.  Ext: No edema  MS: Adequate ROM spine, shoulders, hips and knees.  Skin: Intact, no ulcerations or rash noted.  Psych: Good eye contact, normal affect. Memory intact not anxious or depressed appearing.  CNS: CN 2-12 intact, power, tone and sensation normal throughout.       Assessment & Plan:

## 2013-08-10 NOTE — Assessment & Plan Note (Signed)
Uncontrolled add maxzide half daily. DASH diet and commitment to daily physical activity for a minimum of 30 minutes discussed and encouraged, as a part of hypertension management. The importance of attaining a healthy weight is also discussed.

## 2013-08-10 NOTE — Assessment & Plan Note (Signed)
Updated lab needed.  

## 2013-08-10 NOTE — Assessment & Plan Note (Signed)
Deteriorated. Patient re-educated about  the importance of commitment to a  minimum of 150 minutes of exercise per week. The importance of healthy food choices with portion control discussed. Encouraged to start a food diary, count calories and to consider  joining a support group. Sample diet sheets offered. Goals set by the patient for the next several months.    

## 2013-08-10 NOTE — Assessment & Plan Note (Signed)
Hyperlipidemia:Low fat diet discussed and encouraged.  Updated lab needed 

## 2013-08-17 LAB — LIPID PANEL
HDL: 45 mg/dL (ref >39)
Total CHOL/HDL Ratio: 3.3 Ratio
Triglycerides: 57 mg/dL (ref <150)
VLDL: 11 mg/dL (ref 0–40)

## 2013-08-17 LAB — CBC
MCHC: 33.9 g/dL (ref 30.0–36.0)
RBC: 4.44 MIL/uL (ref 3.87–5.11)
RDW: 12.2 % (ref 11.5–15.5)
WBC: 3.1 10*3/uL — ABNORMAL LOW (ref 4.0–10.5)

## 2013-08-17 LAB — BASIC METABOLIC PANEL
Calcium: 9.1 mg/dL (ref 8.4–10.5)
Chloride: 108 mEq/L (ref 96–112)

## 2013-08-18 ENCOUNTER — Other Ambulatory Visit: Payer: Self-pay | Admitting: Family Medicine

## 2013-08-18 LAB — TSH: TSH: 1.043 u[IU]/mL (ref 0.350–4.500)

## 2013-08-21 ENCOUNTER — Other Ambulatory Visit: Payer: Self-pay

## 2013-08-21 MED ORDER — ERGOCALCIFEROL 1.25 MG (50000 UT) PO CAPS: 50000.0000 [IU] | ORAL_CAPSULE | ORAL | Status: DC

## 2013-08-27 ENCOUNTER — Other Ambulatory Visit: Payer: Self-pay | Admitting: Family Medicine

## 2013-09-07 ENCOUNTER — Other Ambulatory Visit: Payer: Self-pay | Admitting: Adult Health

## 2013-10-27 ENCOUNTER — Other Ambulatory Visit: Payer: Self-pay | Admitting: Adult Health

## 2013-11-30 ENCOUNTER — Ambulatory Visit: Payer: BC Managed Care – PPO | Admitting: Family Medicine

## 2013-12-15 ENCOUNTER — Ambulatory Visit: Payer: BC Managed Care – PPO | Admitting: Family Medicine

## 2014-03-09 ENCOUNTER — Other Ambulatory Visit: Payer: Self-pay | Admitting: Advanced Practice Midwife

## 2014-03-25 ENCOUNTER — Ambulatory Visit (INDEPENDENT_AMBULATORY_CARE_PROVIDER_SITE_OTHER): Payer: BC Managed Care – PPO | Admitting: Advanced Practice Midwife

## 2014-03-25 ENCOUNTER — Other Ambulatory Visit (HOSPITAL_COMMUNITY)
Admission: RE | Admit: 2014-03-25 | Discharge: 2014-03-25 | Disposition: A | Discharge status: Home / Self Care | Payer: BC Managed Care – PPO | Source: Ambulatory Visit | Attending: Advanced Practice Midwife | Admitting: Advanced Practice Midwife

## 2014-03-25 ENCOUNTER — Encounter: Payer: Self-pay | Admitting: Advanced Practice Midwife

## 2014-03-25 VITALS — BP 130/82 | Ht 66.0 in | Wt 217.0 lb

## 2014-03-25 DIAGNOSIS — Z01419 Encounter for gynecological examination (general) (routine) without abnormal findings: Secondary | ICD-10-CM | POA: Insufficient documentation

## 2014-03-25 DIAGNOSIS — Z1151 Encounter for screening for human papillomavirus (HPV): Secondary | ICD-10-CM | POA: Insufficient documentation

## 2014-03-25 NOTE — Progress Notes (Signed)
Diamond Le 48 y.o.  Filed Vitals:   03/25/14 1407  BP: 130/82     Past Medical History: Past Medical History  Diagnosis Date  . Hypertension   . Vitamin D deficiency   . Obesity   . Palpitation     Past Surgical History: History reviewed. No pertinent past surgical history.  Family History: Family History  Problem Relation Age of Onset  . Asthma Mother     most of her life   . Lung cancer    . Cancer Father     lung  . Cancer Paternal Aunt     cervical   . Ovarian cancer Other     Social History: History  Substance Use Topics  . Smoking status: Never Smoker   . Smokeless tobacco: Never Used  . Alcohol Use: No    Allergies: No Known Allergies   History of Present Illness:  Current outpatient prescriptions:amLODipine (NORVASC) 10 MG tablet, TAKE 1 TABLET BY MOUTH EVERY DAY, Disp: 30 tablet, Rfl: 4;  triamterene-hydrochlorothiazide (MAXZIDE-25) 37.5-25 MG per tablet, Take 1 tablet by mouth daily., Disp: 30 tablet, Rfl: 11;  Vitamin D, Ergocalciferol, (DRISDOL) 50000 UNITS CAPS capsule, , Disp: , Rfl:     Review of Systems   Patient denies any headaches, blurred vision, shortness of breath, chest pain, abdominal pain, problems with bowel movements, urination, or intercourse. For about 2 weeks has been having intermittent lower cramping with some back pain, "feels like menstrual cramps".  Wants BTL  Physical Exam: General:  Well developed, well nourished, no acute distress Skin:  Warm and dry Neck:  Midline trachea, normal thyroid Lungs; Clear to auscultation bilaterally Breast:  No dominant palpable mass, retraction, or nipple discharge Cardiovascular: Regular rate and rhythm Abdomen:  Soft, non tender, no hepatosplenomegaly Pelvic:  External genitalia is normal in appearance.  The vagina is normal in appearance.   The cervix is bulbous, posterior.  Uterus is felt to be normal size, shape, and contour, though difficult to palpate d/t habitus.No adnexal  masses or tenderness noted. Urine:  negative Rectal: Good sphincter tone, no polyps, or hemorrhoids felt.   Extremities:  No swelling or varicosities noted Psych:  No mood changes   Impression: Normal well woman exam Probable uterine cramps  Plan:  If normal, pap q 3 years Preop with JVF for salpingectomy Mammogram q year PCP manages labs

## 2014-04-12 ENCOUNTER — Encounter: Payer: BC Managed Care – PPO | Admitting: Obstetrics and Gynecology

## 2014-05-05 ENCOUNTER — Ambulatory Visit: Payer: BC Managed Care – PPO | Admitting: Family Medicine

## 2014-06-15 ENCOUNTER — Ambulatory Visit: Payer: BC Managed Care – PPO | Admitting: Family Medicine

## 2014-06-24 ENCOUNTER — Ambulatory Visit (INDEPENDENT_AMBULATORY_CARE_PROVIDER_SITE_OTHER): Payer: BC Managed Care – PPO | Admitting: Family Medicine

## 2014-06-24 ENCOUNTER — Encounter: Payer: Self-pay | Admitting: Family Medicine

## 2014-06-24 ENCOUNTER — Encounter (INDEPENDENT_AMBULATORY_CARE_PROVIDER_SITE_OTHER): Payer: Self-pay

## 2014-06-24 VITALS — BP 140/90 | HR 83 | Resp 16 | Ht 65.0 in | Wt 216.8 lb

## 2014-06-24 DIAGNOSIS — Z23 Encounter for immunization: Secondary | ICD-10-CM | POA: Insufficient documentation

## 2014-06-24 DIAGNOSIS — Z131 Encounter for screening for diabetes mellitus: Secondary | ICD-10-CM

## 2014-06-24 DIAGNOSIS — E669 Obesity, unspecified: Secondary | ICD-10-CM

## 2014-06-24 DIAGNOSIS — E559 Vitamin D deficiency, unspecified: Secondary | ICD-10-CM

## 2014-06-24 DIAGNOSIS — I10 Essential (primary) hypertension: Secondary | ICD-10-CM

## 2014-06-24 DIAGNOSIS — E785 Hyperlipidemia, unspecified: Secondary | ICD-10-CM

## 2014-06-24 MED ORDER — PHENTERMINE HCL 37.5 MG PO TABS: 37.5000 mg | ORAL_TABLET | Freq: Every day | ORAL | Status: DC

## 2014-06-24 MED ORDER — SPIRONOLACTONE 25 MG PO TABS: 25.0000 mg | ORAL_TABLET | Freq: Every day | ORAL | Status: DC

## 2014-06-24 NOTE — Assessment & Plan Note (Addendum)
Pt refused.

## 2014-06-24 NOTE — Patient Instructions (Addendum)
F/u in 4 month, call if you need me before  New additional med for uncontrolleed BP is spironolactone one daily, continue the other 2 meds  New to help with weight loss is phentermine HALF daily, call if any problems with this  TdAP today  You will get 1500 cal diet sheet  Commit to 30 mins exercise at home every day to improve health  Weight loss goal of 10 pounds in 4 month  Fasting lipid, chem 7, HBA1C, TSH, Vit D and CBC in TWO weeks pls (let new meds get into your system)

## 2014-06-24 NOTE — Assessment & Plan Note (Addendum)
Deteriorated. Patient re-educated about  the importance of commitment to a  minimum of 150 minutes of exercise per week. The importance of healthy food choices with portion control discussed. Encouraged to start a food diary, count calories and to consider  joining a support group. Sample diet sheets offered. Goals set by the patient for the next several months.10 pounds Start half phentermine daily, adverse s/e discussed and pt to call if problems develop and stop the drug

## 2014-06-24 NOTE — Progress Notes (Signed)
   Subjective:    Patient ID: Diamond Le, female    DOB: [birth year], 48 y.o.   MRN: [id number]  HPI The PT is here for follow up and re-evaluation of chronic medical conditions, medication management and review of any available recent lab and radiology data.  Preventive health is updated, specifically  Cancer screening and Immunization.   The PT denies any adverse reactions to current medications since the last visit.  Concerned about excessive weigh gain and will work on this.  There are no specific complaints       Review of Systems See HPI Denies recent fever or chills. Denies sinus pressure, nasal congestion, ear pain or sore throat. Denies chest congestion, productive cough or wheezing. Denies chest pains, palpitations and leg swelling Denies abdominal pain, nausea, vomiting,diarrhea or constipation.   Denies dysuria, frequency, hesitancy or incontinence. Denies joint pain, swelling and limitation in mobility. Denies headaches, seizures, numbness, or tingling. Denies depression, anxiety or insomnia. Denies skin break down or rash.        Objective:   Physical Exam  BP 140/90  Pulse 83  Resp 16  Ht 5\' 5"  (1.651 m)  Wt 216 lb 12.8 oz (98.34 kg)  BMI 36.08 kg/m2  SpO2 98% Patient alert and oriented and in no cardiopulmonary distress.  HEENT: No facial asymmetry, EOMI,   oropharynx pink and moist.  Neck supple no JVD, no mass.  Chest: Clear to auscultation bilaterally.  CVS: S1, S2 no murmurs, no S3.Regular rate.  ABD: Soft non tender.   Ext: No edema  MS: Adequate ROM spine, shoulders, hips and knees.  Skin: Intact, no ulcerations or rash noted.  Psych: Good eye contact, normal affect. Memory intact not anxious or depressed appearing.  CNS: CN 2-12 intact, power,  normal throughout.no focal deficits noted.       Assessment & Plan:  OBESITY, UNSPECIFIED Deteriorated. Patient re-educated about  the importance of commitment to a  minimum of 150  minutes of exercise per week. The importance of healthy food choices with portion control discussed. Encouraged to start a food diary, count calories and to consider  joining a support group. Sample diet sheets offered. Goals set by the patient for the next several months.10 pounds Start half phentermine daily, adverse s/e discussed and pt to call if problems develop and stop the drug     Need for Tdap vaccination Pt refused  ESSENTIAL HYPERTENSION Uncontrolled, spironolactone added. DASH diet and commitment to daily physical activity for a minimum of 30 minutes discussed and encouraged, as a part of hypertension management. The importance of attaining a healthy weight is also discussed.   Dyslipidemia Hyperlipidemia:Low fat diet discussed and encouraged.  Updated lab needed at/ before next visit.   UNSPECIFIED VITAMIN D DEFICIENCY Updated lab needed at/ before next visit.

## 2014-06-27 NOTE — Assessment & Plan Note (Signed)
Updated lab needed at/ before next visit.   

## 2014-06-27 NOTE — Assessment & Plan Note (Signed)
Hyperlipidemia:Low fat diet discussed and encouraged.  Updated lab needed at/ before next visit.  

## 2014-06-27 NOTE — Assessment & Plan Note (Addendum)
Uncontrolled, spironolactone added. DASH diet and commitment to daily physical activity for a minimum of 30 minutes discussed and encouraged, as a part of hypertension management. The importance of attaining a healthy weight is also discussed.

## 2014-07-01 ENCOUNTER — Other Ambulatory Visit: Payer: Self-pay | Admitting: Family Medicine

## 2014-08-30 ENCOUNTER — Other Ambulatory Visit: Payer: Self-pay | Admitting: Family Medicine

## 2014-09-29 ENCOUNTER — Other Ambulatory Visit: Payer: Self-pay | Admitting: Family Medicine

## 2014-10-04 ENCOUNTER — Ambulatory Visit: Payer: BC Managed Care – PPO | Admitting: Family Medicine

## 2014-10-13 ENCOUNTER — Ambulatory Visit: Payer: BC Managed Care – PPO | Admitting: Family Medicine

## 2014-10-13 ENCOUNTER — Encounter: Payer: Self-pay | Admitting: *Deleted

## 2014-10-26 ENCOUNTER — Other Ambulatory Visit: Payer: Self-pay

## 2014-10-26 DIAGNOSIS — Z1231 Encounter for screening mammogram for malignant neoplasm of breast: Secondary | ICD-10-CM

## 2014-11-09 ENCOUNTER — Other Ambulatory Visit: Payer: Self-pay | Admitting: Family Medicine

## 2014-11-16 ENCOUNTER — Ambulatory Visit: Payer: BC Managed Care – PPO

## 2014-12-04 ENCOUNTER — Other Ambulatory Visit: Payer: Self-pay | Admitting: Family Medicine

## 2015-01-03 ENCOUNTER — Encounter: Payer: Self-pay | Admitting: Family Medicine

## 2015-02-08 ENCOUNTER — Other Ambulatory Visit: Payer: Self-pay | Admitting: Advanced Practice Midwife

## 2015-02-22 ENCOUNTER — Encounter: Payer: Self-pay | Admitting: Family Medicine

## 2015-02-22 ENCOUNTER — Ambulatory Visit (INDEPENDENT_AMBULATORY_CARE_PROVIDER_SITE_OTHER): Payer: BLUE CROSS/BLUE SHIELD | Admitting: Family Medicine

## 2015-02-22 VITALS — BP 128/90 | HR 93 | Resp 16 | Ht 65.0 in | Wt 212.0 lb

## 2015-02-22 DIAGNOSIS — E785 Hyperlipidemia, unspecified: Secondary | ICD-10-CM

## 2015-02-22 DIAGNOSIS — E669 Obesity, unspecified: Secondary | ICD-10-CM

## 2015-02-22 DIAGNOSIS — I10 Essential (primary) hypertension: Secondary | ICD-10-CM

## 2015-02-22 DIAGNOSIS — E559 Vitamin D deficiency, unspecified: Secondary | ICD-10-CM

## 2015-02-22 DIAGNOSIS — R7301 Impaired fasting glucose: Secondary | ICD-10-CM

## 2015-02-22 LAB — LIPID PANEL
Cholesterol: 162 mg/dL (ref 0–200)
HDL: 45 mg/dL — AB (ref >=46)
LDL Cholesterol: 104 mg/dL — ABNORMAL HIGH (ref 0–99)
TRIGLYCERIDES: 63 mg/dL (ref <150)
Total CHOL/HDL Ratio: 3.6 Ratio
VLDL: 13 mg/dL (ref 0–40)

## 2015-02-22 LAB — CBC WITH DIFFERENTIAL/PLATELET
BASOS ABS: 0 10*3/uL (ref 0.0–0.1)
BASOS PCT: 0 % (ref 0–1)
EOS ABS: 0.2 10*3/uL (ref 0.0–0.7)
EOS PCT: 3 % (ref 0–5)
HEMATOCRIT: 37.4 % (ref 36.0–46.0)
HEMOGLOBIN: 12.5 g/dL (ref 12.0–15.0)
LYMPHS PCT: 29 % (ref 12–46)
Lymphs Abs: 1.5 10*3/uL (ref 0.7–4.0)
MCH: 28.3 pg (ref 26.0–34.0)
MCHC: 33.4 g/dL (ref 30.0–36.0)
MCV: 84.6 fL (ref 78.0–100.0)
MONO ABS: 0.3 10*3/uL (ref 0.1–1.0)
MPV: 10.2 fL (ref 8.6–12.4)
Monocytes Relative: 5 % (ref 3–12)
Neutro Abs: 3.3 10*3/uL (ref 1.7–7.7)
Neutrophils Relative %: 63 % (ref 43–77)
PLATELETS: 309 10*3/uL (ref 150–400)
RBC: 4.42 MIL/uL (ref 3.87–5.11)
RDW: 12.3 % (ref 11.5–15.5)
WBC: 5.3 10*3/uL (ref 4.0–10.5)

## 2015-02-22 LAB — COMPREHENSIVE METABOLIC PANEL
ALBUMIN: 4.3 g/dL (ref 3.5–5.2)
ALT: 11 U/L (ref 0–35)
AST: 15 U/L (ref 0–37)
Alkaline Phosphatase: 70 U/L (ref 39–117)
BUN: 14 mg/dL (ref 6–23)
CO2: 31 meq/L (ref 19–32)
CREATININE: 0.92 mg/dL (ref 0.50–1.10)
Calcium: 9.9 mg/dL (ref 8.4–10.5)
Chloride: 103 mEq/L (ref 96–112)
Glucose, Bld: 83 mg/dL (ref 70–99)
POTASSIUM: 3.8 meq/L (ref 3.5–5.3)
Sodium: 138 mEq/L (ref 135–145)
TOTAL PROTEIN: 7.3 g/dL (ref 6.0–8.3)
Total Bilirubin: 0.6 mg/dL (ref 0.2–1.2)

## 2015-02-22 LAB — TSH: TSH: 1.269 u[IU]/mL (ref 0.350–4.500)

## 2015-02-22 MED ORDER — PHENTERMINE HCL 37.5 MG PO TABS: 37.5000 mg | ORAL_TABLET | Freq: Every day | ORAL | Status: DC

## 2015-02-22 MED ORDER — AMLODIPINE BESYLATE 10 MG PO TABS: 10.0000 mg | ORAL_TABLET | Freq: Every day | ORAL | Status: DC

## 2015-02-22 MED ORDER — SPIRONOLACTONE 25 MG PO TABS: 25.0000 mg | ORAL_TABLET | Freq: Every day | ORAL | Status: DC

## 2015-02-22 NOTE — Patient Instructions (Signed)
F/u in 4 month, call if you need me before  Fasting cBC, lipid, cmp, HBA1C, TSH and Vit D today  Commit to TWO blood pressure med at same time every evening  Reduce salt and meat intake  It is important that you exercise regularly at least 30 minutes 5 times a week. If you develop chest pain, have severe difficulty breathing, or feel very tired, stop exercising immediately and seek medical attention    Half phentermine daily  Weight loss goal of 3 to 4 pounds per month

## 2015-02-23 LAB — HEMOGLOBIN A1C
Hgb A1c MFr Bld: 5.2 % (ref <5.7)
MEAN PLASMA GLUCOSE: 103 mg/dL (ref <117)

## 2015-02-23 LAB — VITAMIN D 25 HYDROXY (VIT D DEFICIENCY, FRACTURES): VIT D 25 HYDROXY: 30 ng/mL (ref 30–100)

## 2015-03-15 ENCOUNTER — Encounter: Payer: BLUE CROSS/BLUE SHIELD | Admitting: Adult Health

## 2015-03-16 NOTE — Progress Notes (Signed)
This encounter was created in error - please disregard.

## 2015-03-19 NOTE — Progress Notes (Signed)
Diamond Le     MRN: [id number]      DOB: 06-Apr-1966   HPI Diamond Le is here for follow up and re-evaluation of chronic medical conditions, medication management and review of any available recent lab and radiology data.  Preventive health is updated, specifically  Cancer screening and Immunization.   Questions or concerns regarding consultations or procedures which the PT has had in the interim are  addressed. The PT denies any adverse reactions to current medications since the last visit.  Main concern is continued weight gain, wants help with getting control of her weight and adopting healthy lifestyle   ROS Denies recent fever or chills. Denies sinus pressure, nasal congestion, ear pain or sore throat. Denies chest congestion, productive cough or wheezing. Denies chest pains, palpitations and leg swelling Denies abdominal pain, nausea, vomiting,diarrhea or constipation.   Denies dysuria, frequency, hesitancy or incontinence. Denies joint pain, swelling and limitation in mobility. Denies headaches, seizures, numbness, or tingling. Denies depression, anxiety or insomnia. Denies skin break down or rash.   PE  BP 128/90 mmHg  Pulse 93  Resp 16  Ht 5\' 5"  (1.651 m)  Wt 212 lb (96.163 kg)  BMI 35.28 kg/m2  SpO2 99%  Patient alert and oriented and in no cardiopulmonary distress.  HEENT: No facial asymmetry, EOMI,   oropharynx pink and moist.  Neck supple no JVD, no mass.  Chest: Clear to auscultation bilaterally.  CVS: S1, S2 no murmurs, no S3.Regular rate.  ABD: Soft non tender.   Ext: No edema  MS: Adequate ROM spine, shoulders, hips and knees.  Skin: Intact, no ulcerations or rash noted.  Psych: Good eye contact, normal affect. Memory intact not anxious or depressed appearing.  CNS: CN 2-12 intact, power,  normal throughout.no focal deficits noted.   Assessment & Plan   Essential hypertension Uncontrolled, non compliant with meds, sometimes "forgets" the  2nd med, she is advised to take both at the  Same time DASH diet and commitment to daily physical activity for a minimum of 30 minutes discussed and encouraged, as a part of hypertension management. The importance of attaining a healthy weight is also discussed.  BP/Weight 02/22/2015 06/24/2014 03/25/2014 08/06/2013 01/02/2012 03/05/2011 7/40/8144  Systolic BP 818 563 149 702 637 858 850  Diastolic BP 90 90 82 92 94 100 100  Wt. (Lbs) 212 216.8 217 204.4 199 208 210.25  BMI 35.28 36.08 35.04 34.01 33.12 34.61 33.95         Obesity Deteriorated. Patient re-educated about  the importance of commitment to a  minimum of 150 minutes of exercise per week.  The importance of healthy food choices with portion control discussed. Encouraged to start a food diary, count calories and to consider  joining a support group. Sample diet sheets offered. Goals set by the patient for the next several months. Start half phentermine daily   Weight /BMI 02/22/2015 06/24/2014 03/25/2014  WEIGHT 212 lb 216 lb 12.8 oz 217 lb  HEIGHT 5\' 5"  5\' 5"  5\' 6"   BMI 35.28 kg/m2 36.08 kg/m2 35.04 kg/m2    Current exercise per week 60 minutes.    Dyslipidemia Elevated LDL and low HDL Hyperlipidemia:Low fat diet discussed and encouraged.   Lipid Panel  Lab Results  Component Value Date   CHOL 162 02/22/2015   HDL 45* 02/22/2015   LDLCALC 104* 02/22/2015   TRIG 63 02/22/2015   CHOLHDL 3.6 02/22/2015         Vitamin D deficiency Under  corrected, non compliant with weekly med, she is to resume same    \

## 2015-03-19 NOTE — Assessment & Plan Note (Signed)
Uncontrolled, non compliant with meds, sometimes "forgets" the 2nd med, she is advised to take both at the  Same time DASH diet and commitment to daily physical activity for a minimum of 30 minutes discussed and encouraged, as a part of hypertension management. The importance of attaining a healthy weight is also discussed.  BP/Weight 02/22/2015 06/24/2014 03/25/2014 08/06/2013 01/02/2012 03/05/2011 12/07/1622  Systolic BP 469 507 225 750 518 335 825  Diastolic BP 90 90 82 92 94 100 100  Wt. (Lbs) 212 216.8 217 204.4 199 208 210.25  BMI 35.28 36.08 35.04 34.01 33.12 34.61 33.95

## 2015-03-19 NOTE — Assessment & Plan Note (Signed)
Deteriorated. Patient re-educated about  the importance of commitment to a  minimum of 150 minutes of exercise per week.  The importance of healthy food choices with portion control discussed. Encouraged to start a food diary, count calories and to consider  joining a support group. Sample diet sheets offered. Goals set by the patient for the next several months. Start half phentermine daily   Weight /BMI 02/22/2015 06/24/2014 03/25/2014  WEIGHT 212 lb 216 lb 12.8 oz 217 lb  HEIGHT 5\' 5"  5\' 5"  5\' 6"   BMI 35.28 kg/m2 36.08 kg/m2 35.04 kg/m2    Current exercise per week 60 minutes.

## 2015-03-19 NOTE — Assessment & Plan Note (Signed)
Under corrected, non compliant with weekly med, she is to resume same

## 2015-03-19 NOTE — Assessment & Plan Note (Signed)
Elevated LDL and low HDL Hyperlipidemia:Low fat diet discussed and encouraged.   Lipid Panel  Lab Results  Component Value Date   CHOL 162 02/22/2015   HDL 45* 02/22/2015   LDLCALC 104* 02/22/2015   TRIG 63 02/22/2015   CHOLHDL 3.6 02/22/2015

## 2015-03-28 ENCOUNTER — Other Ambulatory Visit: Payer: BLUE CROSS/BLUE SHIELD | Admitting: Adult Health

## 2015-04-19 ENCOUNTER — Encounter: Payer: Self-pay | Admitting: Adult Health

## 2015-04-19 ENCOUNTER — Ambulatory Visit (INDEPENDENT_AMBULATORY_CARE_PROVIDER_SITE_OTHER): Payer: BLUE CROSS/BLUE SHIELD | Admitting: Adult Health

## 2015-04-19 VITALS — BP 132/80 | HR 84 | Ht 64.5 in | Wt 212.0 lb

## 2015-04-19 DIAGNOSIS — Z1212 Encounter for screening for malignant neoplasm of rectum: Secondary | ICD-10-CM | POA: Diagnosis not present

## 2015-04-19 DIAGNOSIS — Z01419 Encounter for gynecological examination (general) (routine) without abnormal findings: Secondary | ICD-10-CM | POA: Diagnosis not present

## 2015-04-19 DIAGNOSIS — L739 Follicular disorder, unspecified: Secondary | ICD-10-CM

## 2015-04-19 DIAGNOSIS — B369 Superficial mycosis, unspecified: Secondary | ICD-10-CM

## 2015-04-19 DIAGNOSIS — Z113 Encounter for screening for infections with a predominantly sexual mode of transmission: Secondary | ICD-10-CM

## 2015-04-19 HISTORY — DX: Superficial mycosis, unspecified: B36.9

## 2015-04-19 HISTORY — DX: Follicular disorder, unspecified: L73.9

## 2015-04-19 LAB — HEMOCCULT GUIAC POC 1CARD (OFFICE): Fecal Occult Blood, POC: NEGATIVE

## 2015-04-19 MED ORDER — NYSTATIN 100000 UNIT/GM EX POWD: CUTANEOUS | Status: DC

## 2015-04-19 MED ORDER — FLUCONAZOLE 150 MG PO TABS: ORAL_TABLET | ORAL | Status: DC

## 2015-04-19 MED ORDER — SULFAMETHOXAZOLE-TRIMETHOPRIM 800-160 MG PO TABS: 1.0000 | ORAL_TABLET | Freq: Two times a day (BID) | ORAL | Status: DC

## 2015-04-19 NOTE — Progress Notes (Signed)
Patient ID: Diamond Le, female   DOB: [birth year], 49 y.o.   MRN: [id number] History of Present Illness: Diamond Le is a 49 year old white female in for a well woman gyn exam.She had a normal pap with negative HPV 03/25/14. PCP is Dr Moshe Cipro.  Current Medications, Allergies, Past Medical History, Past Surgical History, Family History and Social History were reviewed in [employer] record.     Review of Systems: Patient denies any headaches, hearing loss, fatigue, blurred vision, shortness of breath, chest pain, abdominal pain, problems with bowel movements, urination, or intercourse. No joint pain or mood swings.She wants to be checked for GC/CHL but declines blood tests for HIV, HSV2 and RPR. Periods are more irregular lately.She is using condoms. She complains of heat rash and lump under arm.   Physical Exam:BP 132/80 mmHg  Pulse 84  Ht 5' 4.5" (1.638 m)  Wt 212 lb (96.163 kg)  BMI 35.84 kg/m2 General:  Well developed, well nourished, no acute distress Skin:  Warm and dry Neck:  Midline trachea, normal thyroid, good ROM, no lymphadenopathy Lungs; Clear to auscultation bilaterally Breast:  No dominant palpable mass, retraction, or nipple discharge, has folliculitis under right arm Cardiovascular: Regular rate and rhythm Abdomen:  Soft, non tender, no hepatosplenomegaly, has skin fungus under panniculus and in groin Pelvic:  External genitalia is normal in appearance, no lesions.  The vagina is normal in appearance. Urethra has no lesions or masses. The cervix is bulbous. GC/CHL obtained. Uterus is felt to be normal size, shape, and contour.  No adnexal masses or tenderness noted.Bladder is non tender, no masses felt. Rectal: Good sphincter tone, no polyps, or hemorrhoids felt.  Hemoccult negative. Extremities/musculoskeletal:  No swelling or varicosities noted, no clubbing or cyanosis Psych:  No mood changes, alert and cooperative,seems happy   Impression: Well  woman gyn exam no pap Skin fungus Folliculitis  STD screening   Plan: GC/CHL sent Rx septra ds #28 take 1 bid Rx diflucan 150 mg #2 take 1 now and 1 in 3 days with 1 refill Rx nystatin powder use 2-3 x daily to affected area prn Physical in 1 year Pap 2018 Labs with PCP Mammogram yearly Keep skin clean and dry, do not shave for several days and [place] alcohol not deodorant for 2 weeks

## 2015-04-19 NOTE — Patient Instructions (Signed)
Physical in  1 year Yeast Infection of the Skin Some yeast on the skin is normal, but sometimes it causes an infection. If you have a yeast infection, it shows up as white or light brown patches on brown skin. You can see it better in the summer on tan skin. It causes light-colored holes in your suntan. It can happen on any area of the body. This cannot be passed from person to person. HOME CARE  Scrub your skin daily with a dandruff shampoo. Your rash may take a couple weeks to get well.  Do not scratch or itch the rash. GET HELP RIGHT AWAY IF:   You get another infection from scratching. The skin may get warm, red, and may ooze fluid.  The infection does not seem to be getting better. MAKE SURE YOU:  Understand these instructions.  Will watch your condition.  Will get help right away if you are not doing well or get worse. Document Released: 10/25/2008 Document Revised: 02/04/2012 Document Reviewed: 10/25/2008 [facility] Patient Information 2015 [place], [place]. This information is not intended to replace advice given to you by your health care provider. Make sure you discuss any questions you have with your health care provider.  Pap 2018 mammogram year Labs wit PCP

## 2015-04-20 LAB — GC/CHLAMYDIA PROBE AMP
Chlamydia trachomatis, NAA: NEGATIVE
Neisseria gonorrhoeae by PCR: NEGATIVE

## 2015-04-21 ENCOUNTER — Ambulatory Visit
Admission: RE | Admit: 2015-04-21 | Discharge: 2015-04-21 | Disposition: A | Discharge status: Home / Self Care | Payer: BLUE CROSS/BLUE SHIELD | Source: Ambulatory Visit

## 2015-04-21 DIAGNOSIS — Z1231 Encounter for screening mammogram for malignant neoplasm of breast: Secondary | ICD-10-CM

## 2015-06-21 ENCOUNTER — Ambulatory Visit: Payer: BLUE CROSS/BLUE SHIELD | Admitting: Family Medicine

## 2015-11-24 ENCOUNTER — Emergency Department (HOSPITAL_COMMUNITY)
Admission: EM | Admit: 2015-11-24 | Discharge: 2015-11-24 | Disposition: A | Discharge status: Home / Self Care | Payer: BLUE CROSS/BLUE SHIELD | Attending: Emergency Medicine | Admitting: Emergency Medicine

## 2015-11-24 ENCOUNTER — Encounter (HOSPITAL_COMMUNITY): Payer: Self-pay

## 2015-11-24 DIAGNOSIS — X58XXXA Exposure to other specified factors, initial encounter: Secondary | ICD-10-CM | POA: Insufficient documentation

## 2015-11-24 DIAGNOSIS — Z872 Personal history of diseases of the skin and subcutaneous tissue: Secondary | ICD-10-CM | POA: Insufficient documentation

## 2015-11-24 DIAGNOSIS — Y998 Other external cause status: Secondary | ICD-10-CM | POA: Insufficient documentation

## 2015-11-24 DIAGNOSIS — M79661 Pain in right lower leg: Secondary | ICD-10-CM

## 2015-11-24 DIAGNOSIS — S8991XA Unspecified injury of right lower leg, initial encounter: Secondary | ICD-10-CM | POA: Insufficient documentation

## 2015-11-24 DIAGNOSIS — Z792 Long term (current) use of antibiotics: Secondary | ICD-10-CM | POA: Insufficient documentation

## 2015-11-24 DIAGNOSIS — E669 Obesity, unspecified: Secondary | ICD-10-CM | POA: Insufficient documentation

## 2015-11-24 DIAGNOSIS — Y9289 Other specified places as the place of occurrence of the external cause: Secondary | ICD-10-CM | POA: Insufficient documentation

## 2015-11-24 DIAGNOSIS — Z8619 Personal history of other infectious and parasitic diseases: Secondary | ICD-10-CM | POA: Insufficient documentation

## 2015-11-24 DIAGNOSIS — Y9389 Activity, other specified: Secondary | ICD-10-CM | POA: Insufficient documentation

## 2015-11-24 DIAGNOSIS — I10 Essential (primary) hypertension: Secondary | ICD-10-CM | POA: Insufficient documentation

## 2015-11-24 DIAGNOSIS — Z79899 Other long term (current) drug therapy: Secondary | ICD-10-CM | POA: Insufficient documentation

## 2015-11-24 MED ORDER — METHOCARBAMOL 500 MG PO TABS: 500.0000 mg | ORAL_TABLET | Freq: Three times a day (TID) | ORAL | Status: DC

## 2015-11-24 MED ORDER — HYDROCODONE-ACETAMINOPHEN 5-325 MG PO TABS: 1.0000 | ORAL_TABLET | ORAL | Status: DC | PRN

## 2015-11-24 MED ORDER — HYDROCODONE-ACETAMINOPHEN 5-325 MG PO TABS
2.0000 | ORAL_TABLET | Freq: Once | ORAL | Status: AC
  Administered 2015-11-24: 2 via ORAL
  Filled 2015-11-24: qty 2

## 2015-11-24 MED ORDER — DIAZEPAM 5 MG PO TABS
5.0000 mg | ORAL_TABLET | Freq: Once | ORAL | Status: AC
  Administered 2015-11-24: 5 mg via ORAL
  Filled 2015-11-24: qty 1

## 2015-11-24 MED ORDER — ONDANSETRON HCL 4 MG PO TABS
4.0000 mg | ORAL_TABLET | Freq: Once | ORAL | Status: AC
  Administered 2015-11-24: 4 mg via ORAL
  Filled 2015-11-24: qty 1

## 2015-11-24 NOTE — ED Notes (Signed)
Pt reports that she went to turn around this am and felt a pop in right calf area

## 2015-11-24 NOTE — Discharge Instructions (Signed)
Please use an ice pack to the right calf tonight. Use warm tub soaks starting tomorrow. Use crutches until able to ambulate safely. Use Robaxin and Norco for pain. Both of these medications may cause drowsiness, please use with caution. See Dr Aline Brochure for evaluation if not improving Musculoskeletal Pain Musculoskeletal pain is muscle and boney aches and pains. These pains can occur in any part of the body. Your caregiver may treat you without knowing the cause of the pain. They may treat you if blood or urine tests, X-rays, and other tests were normal.  CAUSES There is often not a definite cause or reason for these pains. These pains may be caused by a type of germ (virus). The discomfort may also come from overuse. Overuse includes working out too hard when your body is not fit. Boney aches also come from weather changes. Bone is sensitive to atmospheric pressure changes. HOME CARE INSTRUCTIONS   Ask when your test results will be ready. Make sure you get your test results.  Only take over-the-counter or prescription medicines for pain, discomfort, or fever as directed by your caregiver. If you were given medications for your condition, do not drive, operate machinery or power tools, or sign legal documents for 24 hours. Do not drink alcohol. Do not take sleeping pills or other medications that may interfere with treatment.  Continue all activities unless the activities cause more pain. When the pain lessens, slowly resume normal activities. Gradually increase the intensity and duration of the activities or exercise.  During periods of severe pain, bed rest may be helpful. Lay or sit in any position that is comfortable.  Putting ice on the injured area.  Put ice in a bag.  Place a towel between your skin and the bag.  Leave the ice on for 15 to 20 minutes, 3 to 4 times a day.  Follow up with your caregiver for continued problems and no reason can be found for the pain. If the pain becomes  worse or does not go away, it may be necessary to repeat tests or do additional testing. Your caregiver may need to look further for a possible cause. SEEK IMMEDIATE MEDICAL CARE IF:  You have pain that is getting worse and is not relieved by medications.  You develop chest pain that is associated with shortness or breath, sweating, feeling sick to your stomach (nauseous), or throw up (vomit).  Your pain becomes localized to the abdomen.  You develop any new symptoms that seem different or that concern you. MAKE SURE YOU:   Understand these instructions.  Will watch your condition.  Will get help right away if you are not doing well or get worse.   This information is not intended to replace advice given to you by your health care provider. Make sure you discuss any questions you have with your health care provider.   Document Released: 11/12/2005 Document Revised: 02/04/2012 Document Reviewed: 07/17/2013 Elsevier Interactive Patient Education [employer].

## 2015-11-24 NOTE — ED Provider Notes (Signed)
CSN: [id number]     Arrival date & time 11/24/15  1924 History   None    Chief Complaint  Patient presents with  . Leg Pain     (Consider location/radiation/quality/duration/timing/severity/associated sxs/prior Treatment) HPI Comments: Pt made a turn and felt a pop in the calf of the right lower leg. Now can not apply full weight on the right leg.  Patient is a 49 y.o. female presenting with leg pain. The history is provided by the patient.  Leg Pain Location:  Leg Leg location:  R lower leg Pain details:    Quality:  Throbbing   Severity:  Moderate   Onset quality:  Sudden   Timing:  Intermittent   Progression:  Worsening Chronicity:  New Dislocation: no   Associated symptoms: no back pain and no neck pain     Past Medical History  Diagnosis Date  . Hypertension   . Vitamin D deficiency   . Obesity   . Palpitation   . Superficial fungus infection of skin 04/19/2015  . Folliculitis [id number]   History reviewed. No pertinent past surgical history. Family History  Problem Relation Age of Onset  . Asthma Mother     most of her life   . Cancer Father     lung  . Cancer Paternal Aunt     cervical   . Ovarian cancer Other   . Hypertension Maternal Grandmother    Social History  Substance Use Topics  . Smoking status: Never Smoker   . Smokeless tobacco: Never Used  . Alcohol Use: No   OB History    Gravida Para Term Preterm AB TAB SAB Ectopic Multiple Living   2 2        2      Review of Systems  Constitutional: Negative for activity change.       All ROS Neg except as noted in HPI  HENT: Negative for nosebleeds.   Eyes: Negative for photophobia and discharge.  Respiratory: Negative for cough, shortness of breath and wheezing.   Cardiovascular: Negative for chest pain and palpitations.  Gastrointestinal: Negative for abdominal pain and blood in stool.  Genitourinary: Negative for dysuria, frequency and hematuria.  Musculoskeletal: Positive for arthralgias.  Negative for back pain and neck pain.  Skin: Negative.   Neurological: Negative for dizziness, seizures and speech difficulty.  Psychiatric/Behavioral: Negative for hallucinations and confusion.      Allergies  Review of patient's allergies indicates no known allergies.  Home Medications   Prior to Admission medications   Medication Sig Start Date End Date Taking? Authorizing Provider  amLODipine (NORVASC) 10 MG tablet Take 1 tablet (10 mg total) by mouth daily. 02/22/15   Fayrene Helper, MD  fluconazole (DIFLUCAN) 150 MG tablet Take 1 now and 1 in 3 days 04/19/15   Estill Dooms, NP  nystatin (MYCOSTATIN/NYSTOP) 100000 UNIT/GM POWD Apply 2-3 x daily to affected area prn 04/19/15   Estill Dooms, NP  phentermine (ADIPEX-P) 37.5 MG tablet Take 1 tablet (37.5 mg total) by mouth daily before breakfast. Patient not taking: Reported on 04/19/2015 02/22/15   Fayrene Helper, MD  spironolactone (ALDACTONE) 25 MG tablet Take 1 tablet (25 mg total) by mouth daily. 02/22/15   Fayrene Helper, MD  sulfamethoxazole-trimethoprim (BACTRIM DS,SEPTRA DS) 800-160 MG per tablet Take 1 tablet by mouth 2 (two) times daily. 04/19/15   Estill Dooms, NP  triamterene-hydrochlorothiazide (MAXZIDE-25) 37.5-25 MG per tablet 1 tablet daily.  03/13/15   Historical Provider,  MD   BP 162/100 mmHg  Pulse 81  Temp(Src) 98.2 F (36.8 C) (Oral)  Resp 18  Ht 5\' 6"  (1.676 m)  Wt 97.523 kg  BMI 34.72 kg/m2  SpO2 100%  LMP 11/21/2015 Physical Exam  Constitutional: She is oriented to person, place, and time. She appears well-developed and well-nourished.  Non-toxic appearance.  HENT:  Head: Normocephalic.  Right Ear: Tympanic membrane and external ear normal.  Left Ear: Tympanic membrane and external ear normal.  Eyes: EOM and lids are normal. Pupils are equal, round, and reactive to light.  Neck: Normal range of motion. Neck supple. Carotid bruit is not present.  Cardiovascular: Normal rate,  regular rhythm, normal heart sounds, intact distal pulses and normal pulses.   Pulmonary/Chest: Breath sounds normal. No respiratory distress.  Abdominal: Soft. Bowel sounds are normal. There is no tenderness. There is no guarding.  Musculoskeletal: Normal range of motion.       Right lower leg: She exhibits tenderness.  Lymphadenopathy:       Head (right side): No submandibular adenopathy present.       Head (left side): No submandibular adenopathy present.    She has no cervical adenopathy.  Neurological: She is alert and oriented to person, place, and time. She has normal strength. No cranial nerve deficit or sensory deficit.  Skin: Skin is warm and dry.  Psychiatric: She has a normal mood and affect. Her speech is normal.  Nursing note and vitals reviewed.   ED Course  Procedures (including critical care time) Labs Review Labs Reviewed - No data to display  Imaging Review No results found. I have personally reviewed and evaluated these images and lab results as part of my medical decision-making.   EKG Interpretation None      MDM  The Achilles tendon is intact. There is no hematoma or deformity present of the calf on the right. There no neurovascular changes appreciated. Suspect the patient has a muscle strain involving the calf area. The patient is treated with hydrocodone and Robaxin. The patient is asked to alternate heat and ice to the area. She is to follow-up with orthopedics if not improving. Patient is in agreement with this discharge plan.    Final diagnoses:  Right calf pain    *I have reviewed nursing notes, vital signs, and all appropriate lab and imaging results for this patient.[address] PA-C 11/26/15 1820  Milton Ferguson, MD 11/28/15 1524

## 2016-01-25 ENCOUNTER — Encounter: Payer: Self-pay | Admitting: Family Medicine

## 2016-01-25 ENCOUNTER — Ambulatory Visit (INDEPENDENT_AMBULATORY_CARE_PROVIDER_SITE_OTHER): Payer: Managed Care, Other (non HMO) | Admitting: Family Medicine

## 2016-01-25 VITALS — BP 130/90 | HR 92 | Resp 18 | Ht 65.0 in | Wt 218.0 lb

## 2016-01-25 DIAGNOSIS — E669 Obesity, unspecified: Secondary | ICD-10-CM

## 2016-01-25 DIAGNOSIS — E785 Hyperlipidemia, unspecified: Secondary | ICD-10-CM | POA: Diagnosis not present

## 2016-01-25 DIAGNOSIS — E559 Vitamin D deficiency, unspecified: Secondary | ICD-10-CM

## 2016-01-25 DIAGNOSIS — R7302 Impaired glucose tolerance (oral): Secondary | ICD-10-CM

## 2016-01-25 DIAGNOSIS — I10 Essential (primary) hypertension: Secondary | ICD-10-CM | POA: Diagnosis not present

## 2016-01-25 DIAGNOSIS — R29898 Other symptoms and signs involving the musculoskeletal system: Secondary | ICD-10-CM | POA: Diagnosis not present

## 2016-01-25 DIAGNOSIS — L739 Follicular disorder, unspecified: Secondary | ICD-10-CM

## 2016-01-25 NOTE — Assessment & Plan Note (Signed)
Deteriorated. Patient re-educated about  the importance of commitment to a  minimum of 150 minutes of exercise per week.  The importance of healthy food choices with portion control discussed. Encouraged to start a food diary, count calories and to consider  joining a support group. Sample diet sheets offered. Goals set by the patient for the next several months.   Weight /BMI 01/25/2016 11/24/2015 04/19/2015  WEIGHT 218 lb 215 lb 212 lb  HEIGHT 5\' 5"  5\' 6"  5' 4.5"  BMI 36.28 kg/m2 34.72 kg/m2 35.84 kg/m2    Current exercise per week 60 minutes.

## 2016-01-25 NOTE — Assessment & Plan Note (Signed)
Chronic right axillary cyst , no tender. Not painful Pt reassured not a lymph node Mammogram is up to date. Reminded of date for next mammogram

## 2016-01-25 NOTE — Progress Notes (Signed)
Subjective:    Patient ID: Diamond Le, female    DOB: 06-Feb-1966, 50 y.o.   MRN: [id number]  HPI   Diamond Le     MRN: [id number]      DOB: 22-Jan-1966   HPI Diamond Le is here for follow up and re-evaluation of chronic medical conditions, medication management and review of any available recent lab and radiology data.  Preventive health is updated, specifically  Cancer screening and Immunization.   Was in the ED in December due to acte right calf weakness, when she was unable to weight bear, states she "felt a pop" in her calf prior to loss of balance. No associated trauma, no tes only significant deterioration  In her vision and will get eye exam No known f/h of neurologic disease. Completely recovered from the episode Increased stress and anxiety due to studies, denies depression  ROS Denies recent fever or chills. Denies sinus pressure, nasal congestion, ear pain or sore throat. Denies chest congestion, productive cough or wheezing. Denies chest pains, palpitations and leg swelling Denies abdominal pain, nausea, vomiting,diarrhea or constipation.   Denies dysuria, frequency, hesitancy or incontinence. Denies joint pain, swelling and limitation in mobility. Denies headaches, seizures, numbness, or tingling.  Denies skin break down or rash.   PE  BP 130/90 mmHg  Pulse 92  Resp 18  Ht 5\' 5"  (1.651 m)  Wt 218 lb (98.884 kg)  BMI 36.28 kg/m2  SpO2 99%  Patient alert and oriented and in no cardiopulmonary distress.  HEENT: No facial asymmetry, EOMI,   oropharynx pink and moist.  Neck supple no JVD, no mass.  Chest: Clear to auscultation bilaterally.  CVS: S1, S2 no murmurs, no S3.Regular rate.  ABD: Soft non tender.   Ext: No edema  MS: Adequate ROM spine, shoulders, hips and knees.  Skin: Intact, no ulcerations or rash noted.  Psych: Good eye contact, normal affect. Memory intact not anxious or depressed appearing.  CNS: CN 2-12 intact, power,  normal  throughout.no focal deficits noted.   Assessment & Plan   Transient right leg weakness Acute episode in December 2016, pt unable to weight bear felt a "pop" in her calf. Reports worsenign vision, no other neuro symptoms currently , needs neuro eval  Essential hypertension Uncontrolled, not actually taking medication as prescribed, due to lack of insurance fro a while, advised and re educated regarding the importance of med adherence DASH diet and commitment to daily physical activity for a minimum of 30 minutes discussed and encouraged, as a part of hypertension management. The importance of attaining a healthy weight is also discussed.  BP/Weight 01/25/2016 11/24/2015 04/19/2015 02/22/2015 06/24/2014 03/25/2014 [id number]  Systolic BP [id number] 0000000 [id number] 0000000 [id number]  Diastolic BP 90 123XX123 80 90 90 82 92  Wt. (Lbs) 218 215 212 212 216.8 217 204.4  BMI 36.28 34.72 35.84 35.28 36.08 35.04 34.01        Obesity Deteriorated. Patient re-educated about  the importance of commitment to a  minimum of 150 minutes of exercise per week.  The importance of healthy food choices with portion control discussed. Encouraged to start a food diary, count calories and to consider  joining a support group. Sample diet sheets offered. Goals set by the patient for the next several months.   Weight /BMI 01/25/2016 11/24/2015 04/19/2015  WEIGHT 218 lb 215 lb 212 lb  HEIGHT 5\' 5"  5\' 6"  5' 4.5"  BMI 36.28 kg/m2 34.72 kg/m2 35.84 kg/m2  Current exercise per week 60 minutes.   Dyslipidemia Updated lab needed  Hyperlipidemia:Low fat diet discussed and encouraged.   Lipid Panel  Lab Results  Component Value Date   CHOL 162 02/22/2015   HDL 45* 02/22/2015   LDLCALC 104* 02/22/2015   TRIG 63 02/22/2015   CHOLHDL 3.6 02/22/2015      .   Folliculitis Chronic right axillary cyst , no tender. Not painful Pt reassured not a lymph node Mammogram is up to date. Reminded of date for next  mammogram      Review of Systems     Objective:   Physical Exam        Assessment & Plan:

## 2016-01-25 NOTE — Assessment & Plan Note (Signed)
Acute episode in December 2016, pt unable to weight bear felt a "pop" in her calf. Reports worsenign vision, no other neuro symptoms currently , needs neuro eval

## 2016-01-25 NOTE — Assessment & Plan Note (Signed)
Updated lab needed  Hyperlipidemia:Low fat diet discussed and encouraged.   Lipid Panel  Lab Results  Component Value Date   CHOL 162 02/22/2015   HDL 45* 02/22/2015   LDLCALC 104* 02/22/2015   TRIG 63 02/22/2015   CHOLHDL 3.6 02/22/2015      .

## 2016-01-25 NOTE — Patient Instructions (Signed)
F/u in 6 month, call if you need me before  You are referred tio Dr Merlene Laughter, pls also make appt for  eye exam  Fasting labs this week please  Schedule mammogram for end May  Please work on good  health habits so that your health will improve. 1. Commitment to daily physical activity for 30 to 60  minutes, if you are able to do this.  2. Commitment to wise food choices. Aim for half of your  food intake to be vegetable and fruit, one quarter starchy foods, and one quarter protein. Try to eat on a regular schedule  3 meals per day, snacking between meals should be limited to vegetables or fruits or small portions of nuts. 64 ounces of water per day is generally recommended, unless you have specific health conditions, like heart failure or kidney failure where you will need to limit fluid intake.  3. Commitment to sufficient and a  good quality of physical and mental rest daily, generally between 6 to 8 hours per day.  WITH PERSISTANCE AND PERSEVERANCE, THE IMPOSSIBLE , BECOMES THE NORM!

## 2016-01-25 NOTE — Assessment & Plan Note (Signed)
Uncontrolled, not actually taking medication as prescribed, due to lack of insurance fro a while, advised and re educated regarding the importance of med adherence DASH diet and commitment to daily physical activity for a minimum of 30 minutes discussed and encouraged, as a part of hypertension management. The importance of attaining a healthy weight is also discussed.  BP/Weight 01/25/2016 11/24/2015 04/19/2015 02/22/2015 06/24/2014 03/25/2014 [id number]  Systolic BP [id number] 0000000 Q000111Q 0000000 [id number]  Diastolic BP 90 [id number] 80 90 90 82 92  Wt. (Lbs) 218 215 212 212 216.8 217 204.4  BMI 36.28 34.72 35.84 35.28 36.08 35.04 34.01

## 2016-02-07 ENCOUNTER — Other Ambulatory Visit: Payer: Self-pay | Admitting: Family Medicine

## 2016-03-02 LAB — CBC
HCT: 37 % (ref 35.0–45.0)
Hemoglobin: 12.1 g/dL (ref 11.7–15.5)
MCH: 27.4 pg (ref 27.0–33.0)
MCHC: 32.7 g/dL (ref 32.0–36.0)
MCV: 83.9 fL (ref 80.0–100.0)
MPV: 10.5 fL (ref 7.5–12.5)
PLATELETS: 292 10*3/uL (ref 140–400)
RBC: 4.41 MIL/uL (ref 3.80–5.10)
RDW: 12.9 % (ref 11.0–15.0)
WBC: 5 10*3/uL (ref 3.8–10.8)

## 2016-03-02 LAB — HEMOGLOBIN A1C
HEMOGLOBIN A1C: 5.1 % (ref <5.7)
Mean Plasma Glucose: 100 mg/dL

## 2016-03-03 LAB — LIPID PANEL
Cholesterol: 143 mg/dL (ref 125–200)
HDL: 46 mg/dL
LDL Cholesterol: 80 mg/dL
Total CHOL/HDL Ratio: 3.1 ratio
Triglycerides: 87 mg/dL
VLDL: 17 mg/dL

## 2016-03-03 LAB — TSH: TSH: 2.27 m[IU]/L

## 2016-03-03 LAB — BASIC METABOLIC PANEL WITH GFR
BUN: 15 mg/dL (ref 7–25)
CO2: 23 mmol/L (ref 20–31)
Calcium: 9 mg/dL (ref 8.6–10.2)
Chloride: 108 mmol/L (ref 98–110)
Creat: 0.95 mg/dL (ref 0.50–1.10)
Glucose, Bld: 87 mg/dL (ref 65–99)
Potassium: 4.2 mmol/L (ref 3.5–5.3)
Sodium: 140 mmol/L (ref 135–146)

## 2016-03-03 LAB — VITAMIN D 25 HYDROXY (VIT D DEFICIENCY, FRACTURES): Vit D, 25-Hydroxy: 15 ng/mL — ABNORMAL LOW (ref 30–100)

## 2016-03-09 ENCOUNTER — Other Ambulatory Visit: Payer: Self-pay | Admitting: Family Medicine

## 2016-03-13 ENCOUNTER — Other Ambulatory Visit: Payer: Self-pay

## 2016-03-13 MED ORDER — SPIRONOLACTONE 25 MG PO TABS: ORAL_TABLET | ORAL | Status: DC

## 2016-04-17 ENCOUNTER — Telehealth: Payer: Self-pay | Admitting: Family Medicine

## 2016-04-17 NOTE — Telephone Encounter (Signed)
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  She will bring form by to be looked over.

## 2016-04-17 NOTE — Telephone Encounter (Signed)
Patient is asking for a returned call from the Nurse

## 2016-04-26 ENCOUNTER — Other Ambulatory Visit: Payer: Self-pay | Admitting: Adult Health

## 2016-05-08 ENCOUNTER — Ambulatory Visit (INDEPENDENT_AMBULATORY_CARE_PROVIDER_SITE_OTHER): Payer: 59 | Admitting: Adult Health

## 2016-05-08 ENCOUNTER — Encounter: Payer: Self-pay | Admitting: Adult Health

## 2016-05-08 VITALS — BP 140/90 | HR 86 | Ht 65.0 in | Wt 216.5 lb

## 2016-05-08 DIAGNOSIS — Z1212 Encounter for screening for malignant neoplasm of rectum: Secondary | ICD-10-CM

## 2016-05-08 DIAGNOSIS — Z01419 Encounter for gynecological examination (general) (routine) without abnormal findings: Secondary | ICD-10-CM

## 2016-05-08 LAB — HEMOCCULT GUIAC POC 1CARD (OFFICE): FECAL OCCULT BLD: NEGATIVE

## 2016-05-08 NOTE — Patient Instructions (Signed)
Pap and physical in 1 year mammogram yearly Labs with PCP Colonoscopy at 6

## 2016-05-08 NOTE — Progress Notes (Signed)
Patient ID: Diamond Le, female   DOB: 1966/07/27, 50 y.o.   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 PCP is Dr Moshe Cipro.  Current Medications, Allergies, Past Medical History, Past Surgical History, Family History and Social History were reviewed in [employer] record.     Review of Systems: Patient denies any headaches, hearing loss, fatigue, blurred vision, shortness of breath, chest pain, abdominal pain, problems with bowel movements, urination, or intercourse. No joint pain or mood swings.    Physical Exam:BP 140/90 mmHg  Pulse 86  Ht 5\' 5"  (1.651 m)  Wt 216 lb 8 oz (98.204 kg)  BMI 36.03 kg/m2  LMP 05/01/2016 temp 98.5 General:  Well developed, well nourished, no acute distress Skin:  Warm and dry Neck:  Midline trachea, normal thyroid, good ROM, no lymphadenopathy,hearing ws normal and vision was 20/30, she uses readers Lungs; Clear to auscultation bilaterally Breast:  No dominant palpable mass, retraction, or nipple discharge Cardiovascular: Regular rate and rhythm Abdomen:  Soft, non tender, no hepatosplenomegaly Pelvic:  External genitalia is normal in appearance, no lesions.  The vagina is normal in appearance. Urethra has no lesions or masses. The cervix is bulbous.  Uterus is felt to be normal size, shape, and contour.  No adnexal masses or tenderness noted.Bladder is non tender, no masses felt. Rectal: Good sphincter tone, no polyps, or hemorrhoids felt.  Hemoccult negative. Extremities/musculoskeletal:  No swelling or varicosities noted, no clubbing or cyanosis Psych:  No mood changes, alert and cooperative,seems happy Filled papers out for school.  Impression: Well woman gyn exam, no pap    Plan: Physical and pap in 1 year Mammogram yearly Labs with PCP Colonoscopy at 89 Follow up in August  with Dr Moshe Cipro  Get eye exam

## 2016-05-09 ENCOUNTER — Telehealth: Payer: Self-pay

## 2016-05-09 DIAGNOSIS — Z020 Encounter for examination for admission to educational institution: Secondary | ICD-10-CM

## 2016-05-09 NOTE — Telephone Encounter (Signed)
Needed labs ordered

## 2016-05-11 LAB — QUANTIFERON TB GOLD ASSAY (BLOOD)
Interferon Gamma Release Assay: NEGATIVE
Quantiferon Nil Value: 0.04 IU/mL
Quantiferon Tb Ag Minus Nil Value: 0 IU/mL

## 2016-05-12 LAB — RUBELLA ANTIBODY, IGM: Rubella IgM: <20 AU/mL (ref <20.00)

## 2016-05-14 ENCOUNTER — Telehealth: Payer: Self-pay | Admitting: Family Medicine

## 2016-05-14 NOTE — Telephone Encounter (Signed)
Patient aware.   Will come and collect lab results for school entrance requirements.

## 2016-05-14 NOTE — Telephone Encounter (Signed)
Patient is asking for lab results, please advise?

## 2016-07-26 ENCOUNTER — Ambulatory Visit: Payer: Managed Care, Other (non HMO) | Admitting: Family Medicine

## 2016-08-21 ENCOUNTER — Ambulatory Visit: Payer: Managed Care, Other (non HMO) | Admitting: Family Medicine

## 2016-08-31 ENCOUNTER — Other Ambulatory Visit: Payer: Self-pay | Admitting: Family Medicine

## 2016-08-31 DIAGNOSIS — Z1239 Encounter for other screening for malignant neoplasm of breast: Secondary | ICD-10-CM

## 2016-09-11 ENCOUNTER — Ambulatory Visit: Payer: Managed Care, Other (non HMO)

## 2016-10-23 ENCOUNTER — Encounter: Payer: Self-pay | Admitting: Family Medicine

## 2016-10-23 ENCOUNTER — Ambulatory Visit (INDEPENDENT_AMBULATORY_CARE_PROVIDER_SITE_OTHER): Payer: Managed Care, Other (non HMO) | Admitting: Family Medicine

## 2016-10-23 ENCOUNTER — Ambulatory Visit: Payer: Managed Care, Other (non HMO)

## 2016-10-23 VITALS — BP 118/80 | HR 92 | Resp 16 | Ht 65.0 in | Wt 216.0 lb

## 2016-10-23 DIAGNOSIS — Z114 Encounter for screening for human immunodeficiency virus [HIV]: Secondary | ICD-10-CM

## 2016-10-23 DIAGNOSIS — E559 Vitamin D deficiency, unspecified: Secondary | ICD-10-CM | POA: Diagnosis not present

## 2016-10-23 DIAGNOSIS — L723 Sebaceous cyst: Secondary | ICD-10-CM

## 2016-10-23 DIAGNOSIS — I10 Essential (primary) hypertension: Secondary | ICD-10-CM | POA: Diagnosis not present

## 2016-10-23 DIAGNOSIS — E785 Hyperlipidemia, unspecified: Secondary | ICD-10-CM | POA: Diagnosis not present

## 2016-10-23 NOTE — Patient Instructions (Addendum)
F/u in 5.5 months, call if you need me before  Excellent BP  Fasting labs `1 week before next visit, cBC, lipid, cmp, tSH, vit D and hIV  You are referred to surgery for cyst in right axilla  It is important that you exercise regularly at least 30 minutes 5 times a week. If you develop chest pain, have severe difficulty breathing, or feel very tired, stop exercising immediately and seek medical attention    Please work on good  health habits so that your health will improve. 1. Commitment to daily physical activity for 30 to 60  minutes, if you are able to do this.  2. Commitment to wise food choices. Aim for half of your  food intake to be vegetable and fruit, one quarter starchy foods, and one quarter protein. Try to eat on a regular schedule  3 meals per day, snacking between meals should be limited to vegetables or fruits or small portions of nuts. 64 ounces of water per day is generally recommended, unless you have specific health conditions, like heart failure or kidney failure where you will need to limit fluid intake.  3. Commitment to sufficient and a  good quality of physical and mental rest daily, generally between 6 to 8 hours per day.  WITH PERSISTANCE AND PERSEVERANCE, THE IMPOSSIBLE , BECOMES THE NORM!  No imaging on neck vein indicated at this time  Thank you  for choosing [facility]. We consider it a privelige to serve you.  Delivering excellent health care in a caring and  compassionate way is our goal.  Partnering with you,  so that together we can achieve this goal is our strategy.

## 2016-11-04 DIAGNOSIS — L723 Sebaceous cyst: Secondary | ICD-10-CM | POA: Insufficient documentation

## 2016-11-04 NOTE — Assessment & Plan Note (Signed)
Hyperlipidemia:Low fat diet discussed and encouraged.   Lipid Panel  Lab Results  Component Value Date   CHOL 143 03/02/2016   HDL 46 03/02/2016   LDLCALC 80 03/02/2016   TRIG 87 03/02/2016   CHOLHDL 3.1 03/02/2016

## 2016-11-04 NOTE — Assessment & Plan Note (Signed)
Recurrent cyst, enlarging , and problematic, refer to surgery

## 2016-11-04 NOTE — Assessment & Plan Note (Signed)
Controlled, no change in medication DASH diet and commitment to daily physical activity for a minimum of 30 minutes discussed and encouraged, as a part of hypertension management. The importance of attaining a healthy weight is also discussed.  BP/Weight 10/23/2016 05/08/2016 01/25/2016 11/24/2015 04/19/2015 02/22/2015 [id number]  Systolic BP 123456 [id number] 0000000 [id number] 0000000 [id number]  Diastolic BP 80 90 90 123XX123 80 90 90  Wt. (Lbs) 216 216.5 218 215 212 212 216.8  BMI 35.94 36.03 36.28 34.72 35.84 35.28 36.08

## 2016-11-04 NOTE — Progress Notes (Signed)
Diamond Le     MRN: [id number]      DOB: 04-17-1966   HPI Diamond Le is here for follow up and re-evaluation of chronic medical conditions, medication management and review of any available recent lab and radiology data.  Preventive health is updated, specifically  Cancer screening and Immunization.   Questions or concerns regarding consultations or procedures which the PT has had in the interim are  addressed. The PT denies any adverse reactions to current medications since the last visit.  C/o recurrent cyst in right armpit , wants this removed Frustrated with weight but will continue to work on this. C/o swollen right neck vein, apinless  ROS Denies recent fever or chills. Denies sinus pressure, nasal congestion, ear pain or sore throat. Denies chest congestion, productive cough or wheezing. Denies chest pains, palpitations and leg swelling Denies abdominal pain, nausea, vomiting,diarrhea or constipation.   Denies dysuria, frequency, hesitancy or incontinence. Denies joint pain, swelling and limitation in mobility. Denies headaches, seizures, numbness, or tingling. Denies depression, anxiety or insomnia.  PE  BP 118/80   Pulse 92   Resp 16   Ht 5\' 5"  (1.651 m)   Wt 216 lb (98 kg)   SpO2 96%   BMI 35.94 kg/m   Patient alert and oriented and in no cardiopulmonary distress.  HEENT: No facial asymmetry, EOMI,   oropharynx pink and moist.  Neck supple no JVD, no mass.No bruit, no tender swollen neck vein on exam  Chest: Clear to auscultation bilaterally.  CVS: S1, S2 no murmurs, no S3.Regular rate.  ABD: Soft non tender.   Ext: No edema  MS: Adequate ROM spine, shoulders, hips and knees.  Skin: Intact, no ulcerations or rash noted. Non tender cyst in right axilla, max diameter approximately 5 cm  Psych: Good eye contact, normal affect. Memory intact not anxious or depressed appearing.  CNS: CN 2-12 intact, power,  normal throughout.no focal deficits  noted.   Assessment & Plan  Essential hypertension Controlled, no change in medication DASH diet and commitment to daily physical activity for a minimum of 30 minutes discussed and encouraged, as a part of hypertension management. The importance of attaining a healthy weight is also discussed.  BP/Weight 10/23/2016 05/08/2016 01/25/2016 11/24/2015 04/19/2015 02/22/2015 [id number]  Systolic BP 123456 [id number] 0000000 [id number] 0000000 [id number]  Diastolic BP 80 90 90 123XX123 80 90 90  Wt. (Lbs) 216 216.5 218 215 212 212 216.8  BMI 35.94 36.03 36.28 34.72 35.84 35.28 36.08       Obesity, morbid (HCC) Deteriorated. Patient re-educated about  the importance of commitment to a  minimum of 150 minutes of exercise per week.  The importance of healthy food choices with portion control discussed. Encouraged to start a food diary, count calories and to consider  joining a support group. Sample diet sheets offered. Goals set by the patient for the next several months.   Weight /BMI 10/23/2016 05/08/2016 01/25/2016  WEIGHT 216 lb 216 lb 8 oz 218 lb  HEIGHT 5\' 5"  5\' 5"  5\' 5"   BMI 35.94 kg/m2 36.03 kg/m2 36.28 kg/m2      Sebaceous cyst of right axilla Recurrent cyst, enlarging , and problematic, refer to surgery  Dyslipidemia Hyperlipidemia:Low fat diet discussed and encouraged.   Lipid Panel  Lab Results  Component Value Date   CHOL 143 03/02/2016   HDL 46 03/02/2016   LDLCALC 80 03/02/2016   TRIG 87 03/02/2016   CHOLHDL 3.1 03/02/2016

## 2016-11-04 NOTE — Assessment & Plan Note (Signed)
Deteriorated. Patient re-educated about  the importance of commitment to a  minimum of 150 minutes of exercise per week.  The importance of healthy food choices with portion control discussed. Encouraged to start a food diary, count calories and to consider  joining a support group. Sample diet sheets offered. Goals set by the patient for the next several months.   Weight /BMI 10/23/2016 05/08/2016 01/25/2016  WEIGHT 216 lb 216 lb 8 oz 218 lb  HEIGHT 5\' 5"  5\' 5"  5\' 5"   BMI 35.94 kg/m2 36.03 kg/m2 36.28 kg/m2

## 2016-11-13 ENCOUNTER — Ambulatory Visit
Admission: RE | Admit: 2016-11-13 | Discharge: 2016-11-13 | Disposition: A | Discharge status: Home / Self Care | Payer: 59 | Source: Ambulatory Visit | Attending: Family Medicine | Admitting: Family Medicine

## 2016-11-13 DIAGNOSIS — Z1239 Encounter for other screening for malignant neoplasm of breast: Secondary | ICD-10-CM

## 2016-11-23 ENCOUNTER — Other Ambulatory Visit: Payer: Self-pay | Admitting: Family Medicine

## 2016-11-30 NOTE — H&P (Signed)
  NTS SOAP Note  Vital Signs:  Vitals as of: 0000000: Systolic [id number]: Diastolic 93: Heart Rate 80: Temp 98.80F (Temporal): Height 25ft 6in: Weight 224Lbs 0 Ounces: BMI 36.15   BMI : 36.15 kg/m2  Subjective: This 51 year old female presents for of an enlarging sebaceous cyst in the right axilla.  Has been present for many years, but has recently increased in size over the past few months and become tender.  No drainage noted.  Not on antibiotic at present time.  Referred by Dr. Moshe Cipro  Review of Symptoms:  Constitutional:negative Head:negative Eyes:negative Nose/Mouth/Throat:negative Cardiovascular:negative Respiratory:negative Gastrointestinnegative Genitourinary:negative Musculoskeletal:negative Breast:negative Hematolgic/Lymphatic:negative Allergic/Immunologic:negative   Past Medical History:Reviewed  Past Medical History  Surgical History: none Medical Problems: HTN Allergies: nkda Medications: amlodipine, spironolactone   Social History:Reviewed  Social History  Preferred Language: English Race:  Black or African American Ethnicity: Not Hispanic / Latino Age: 66 year Marital Status:  M Alcohol: no   Smoking Status: Never smoker reviewed on 11/29/2016 Functional Status reviewed on 11/29/2016 ------------------------------------------------ Bathing: Normal Cooking: Normal Dressing: Normal Driving: Normal Eating: Normal Managing Meds: Normal Oral Care: Normal Shopping: Normal Toileting: Normal Transferring: Normal Walking: Normal Cognitive Status reviewed on 11/29/2016 ------------------------------------------------ Attention: Normal Decision Making: Normal Language: Normal Memory: Normal Motor: Normal Perception: Normal Problem Solving: Normal Visual and Spatial: Normal   Family History:Reviewed  Family Health History Mother, Living; Asthma;  Father, Living; Lung cancer;     Objective Information: General:Well  appearing, well nourished in no distress. 2cm sebaceous cyst with punctum present in right axilla.  No drainage noted.  Tender to touch. Head:Atraumatic; no masses; no abnormalities Neck:Supple without lymphadenopathy.  Heart:RRR, no murmur or gallop.  Normal S1, S2.  No S3, S4.  Lungs:CTA bilaterally, no wheezes, rhonchi, rales.  Breathing unlabored. Dr. Griffin Dakin notes reviewed Assessment:Sebaceous cyst, right axilla  Diagnoses: 706.2  L72.3 Epidermoid cyst of skin (Sebaceous cyst)  Procedures: [id number] - OFFICE OUTPATIENT NEW 20 MINUTES    Plan:  Scheduled for excision of cyst, right axilla on 12/14/16.   Patient Education:Alternative treatments to surgery were discussed with patient (and family).Risks and benefits  of procedure were fully explained to the patient (and family) who gave informed consent. Patient/family questions were addressed.  Follow-up:Pending Surgery

## 2016-12-05 NOTE — Patient Instructions (Signed)
Diamond [name]  12/05/2016     @PREFPERIOPPHARMACY @   Your procedure is scheduled on  12/14/2016   Report to [facility] at  92  A.M.  Call this number if you have problems the morning of surgery:  [phone]   Remember:  Do not eat food or drink liquids after midnight.  Take these medicines the morning of surgery with A SIP OF WATER norvasc   Do not wear jewelry, make-up or nail polish.  Do not wear lotions, powders, or perfumes, or deoderant.  Do not shave 48 hours prior to surgery.  Men may shave face and neck.  Do not bring valuables to the hospital.  [facility] is not responsible for any belongings or valuables.  Contacts, dentures or bridgework may not be worn into surgery.  Leave your suitcase in the car.  After surgery it may be brought to your room.  For patients admitted to the hospital, discharge time will be determined by your treatment team.  Patients discharged the day of surgery will not be allowed to drive home.   Name and phone number of your driver:   family Special instructions:  none  Please read over the following fact sheets that you were given. Anesthesia Post-op Instructions and Care and Recovery After Surgery      Excision of Skin Lesions Introduction Excision of a skin lesion refers to the removal of a section of skin by making small cuts (incisions) in the skin. This procedure may be done to remove a cancerous (malignant) or noncancerous (benign) growth on the skin. It is typically done to treat or prevent cancer or infection. It may also be done to improve cosmetic appearance. The procedure may be done to remove:  Cancerous growths, such as basal cell carcinoma, squamous cell carcinoma, or melanoma.  Noncancerous growths, such as a cyst or lipoma.  Growths, such as moles or skin tags, which may be removed for cosmetic reasons. Various excision or surgical techniques may be used depending on your condition, the location of the  lesion, and your overall health. Tell a health care provider about:  Any allergies you have.  All medicines you are taking, including vitamins, herbs, eye drops, creams, and over-the-counter medicines.  Any problems you or family members have had with anesthetic medicines.  Any blood disorders you have.  Any surgeries you have had.  Any medical conditions you have.  Whether you are pregnant or may be pregnant. What are the risks? Generally, this is a safe procedure. However, problems may occur, including:  Bleeding.  Infection.  Scarring.  Recurrence of the cyst, lipoma, or cancer.  Changes in skin sensation or appearance, such as discoloration or swelling.  Reaction to the anesthetics.  Allergic reaction to surgical materials or ointments.  Damage to nerves, blood vessels, muscles, or other structures.  Continued pain. What happens before the procedure?  Ask your health care provider about:  Changing or stopping your regular medicines. This is especially important if you are taking diabetes medicines or blood thinners.  Taking medicines such as aspirin and ibuprofen. These medicines can thin your blood. Do not take these medicines before your procedure if your health care provider instructs you not to.  You may be asked to take certain medicines.  You may be asked to stop smoking.  You may have an exam or testing.  Plan to have someone take you home after the procedure.  Plan to have someone  help you with activities during recovery. What happens during the procedure?  To reduce your risk of infection:  Your health care team will wash or sanitize their hands.  Your skin will be washed with soap.  You will be given a medicine to numb the area (local anesthetic).  One of the following excision techniques will be performed.  At the end of any of these procedures, antibiotic ointment will be applied as needed. Each of the following techniques may vary  among health care providers and hospitals. Complete Surgical Excision  The area of skin that needs to be removed will be marked with a pen. Using a small scalpel or scissors, the surgeon will gently cut around and under the lesion until it is completely removed. The lesion will be placed in a fluid and sent to the lab for examination. If necessary, bleeding will be controlled with a device that delivers heat (electrocautery). The edges of the wound may be stitched (sutured) together, and a bandage (dressing) will be applied. This procedure may be performed to treat a cancerous growth or a noncancerous cyst or lesion. Excision of a Cyst  The surgeon will make an incision on the cyst. The entire cyst will be removed through the incision. The incision may be closed with sutures. Shave Excision  During shave excision, the surgeon will use a small blade or an electrically heated loop instrument to shave off the lesion. This may be done to remove a mole or a skin tag. The wound will usually be left to heal on its own without sutures. Punch Excision  During punch excision, the surgeon will use a small tool that is like a cookie cutter or a hole punch to cut a circle shape out of the skin. The outer edges of the skin will be sutured together. This may be done to remove a mole or a scar or to perform a biopsy of the lesion. Mohs Micrographic Surgery  During Mohs micrographic surgery, layers of the lesion will be removed with a scalpel or a loop instrument and will be examined right away under a microscope. Layers will be removed until all of the abnormal or cancerous tissue has been removed. This procedure is minimally invasive, and it ensures the best cosmetic outcome. It involves the removal of as little normal tissue as possible. Mohs is usually done to treat skin cancer, such as basal cell carcinoma or squamous cell carcinoma, particularly on the face and ears. Depending on the size of the surgical wound, it  may be sutured closed. What happens after the procedure?  Return to your normal activities as told by your health care provider.  Talk with your health care provider to discuss any test results, treatment options, and if necessary, the need for more tests. This information is not intended to replace advice given to you by your health care provider. Make sure you discuss any questions you have with your health care provider. Document Released: 02/06/2010 Document Revised: 04/19/2016 Document Reviewed: 12/29/2014  2017 Elsevier Excision of Skin Lesions, Care After Introduction Refer to this sheet in the next few weeks. These instructions provide you with information about caring for yourself after your procedure. Your health care provider may also give you more specific instructions. Your treatment has been planned according to current medical practices, but problems sometimes occur. Call your health care provider if you have any problems or questions after your procedure. What can I expect after the procedure? After your procedure, it is common  to have pain or discomfort at the excision site. Follow these instructions at home:  Take over-the-counter and prescription medicines only as told by your health care provider.  Follow instructions from your health care provider about:  How to take care of your excision site. You should keep the site clean, dry, and protected for at least 48 hours.  When and how you should change your bandage (dressing).  When you should remove your dressing.  Removing whatever was used to close your excision site.  Check the excision area every day for signs of infection. Watch for:  Redness, swelling, or pain.  Fluid, blood, or pus.  For bleeding, apply gentle but firm pressure to the area using a folded towel for 20 minutes.  Avoid high-impact exercise and activities until the stitches (sutures) are removed or the area heals.  Follow instructions from  your health care provider about how to minimize scarring. Avoid sun exposure until the area has healed. Scarring should lessen over time.  Keep all follow-up visits as told by your health care provider. This is important. Contact a health care provider if:  You have a fever.  You have redness, swelling, or pain at the excision site.  You have fluid, blood, or pus coming from the excision site.  You have ongoing bleeding at the excision site.  You have pain that does not improve in 2-3 days after your procedure.  You notice skin irregularities or changes in sensation. This information is not intended to replace advice given to you by your health care provider. Make sure you discuss any questions you have with your health care provider. Document Released: 03/29/2015 Document Revised: 04/19/2016 Document Reviewed: 12/29/2014  2017 Elsevier PATIENT INSTRUCTIONS POST-ANESTHESIA  IMMEDIATELY FOLLOWING SURGERY:  Do not drive or operate machinery for the first twenty four hours after surgery.  Do not make any important decisions for twenty four hours after surgery or while taking narcotic pain medications or sedatives.  If you develop intractable nausea and vomiting or a severe headache please notify your doctor immediately.  FOLLOW-UP:  Please make an appointment with your surgeon as instructed. You do not need to follow up with anesthesia unless specifically instructed to do so.  WOUND CARE INSTRUCTIONS (if applicable):  Keep a dry clean dressing on the anesthesia/puncture wound site if there is drainage.  Once the wound has quit draining you may leave it open to air.  Generally you should leave the bandage intact for twenty four hours unless there is drainage.  If the epidural site drains for more than 36-48 hours please call the anesthesia department.  QUESTIONS?:  Please feel free to call your physician or the hospital operator if you have any questions, and they will be happy to assist  you.

## 2016-12-07 ENCOUNTER — Encounter (HOSPITAL_COMMUNITY): Payer: Self-pay

## 2016-12-10 ENCOUNTER — Encounter (HOSPITAL_COMMUNITY): Payer: Self-pay

## 2016-12-10 ENCOUNTER — Encounter (HOSPITAL_COMMUNITY)
Admission: RE | Admit: 2016-12-10 | Discharge: 2016-12-10 | Disposition: A | Discharge status: Home / Self Care | Payer: 59 | Source: Ambulatory Visit | Attending: General Surgery | Admitting: General Surgery

## 2016-12-10 DIAGNOSIS — I10 Essential (primary) hypertension: Secondary | ICD-10-CM | POA: Insufficient documentation

## 2016-12-10 DIAGNOSIS — Z01812 Encounter for preprocedural laboratory examination: Secondary | ICD-10-CM | POA: Insufficient documentation

## 2016-12-10 DIAGNOSIS — L723 Sebaceous cyst: Secondary | ICD-10-CM | POA: Insufficient documentation

## 2016-12-10 DIAGNOSIS — Z01810 Encounter for preprocedural cardiovascular examination: Secondary | ICD-10-CM | POA: Insufficient documentation

## 2016-12-10 LAB — CBC WITH DIFFERENTIAL/PLATELET
Basophils Absolute: 0 10*3/uL (ref 0.0–0.1)
Basophils Relative: 0 %
EOS ABS: 0.2 10*3/uL (ref 0.0–0.7)
EOS PCT: 3 %
HCT: 36.7 % (ref 36.0–46.0)
Hemoglobin: 12.6 g/dL (ref 12.0–15.0)
LYMPHS ABS: 1.4 10*3/uL (ref 0.7–4.0)
LYMPHS PCT: 25 %
MCH: 28.8 pg (ref 26.0–34.0)
MCHC: 34.3 g/dL (ref 30.0–36.0)
MCV: 84 fL (ref 78.0–100.0)
MONOS PCT: 6 %
Monocytes Absolute: 0.4 10*3/uL (ref 0.1–1.0)
Neutro Abs: 3.6 10*3/uL (ref 1.7–7.7)
Neutrophils Relative %: 66 %
PLATELETS: 321 10*3/uL (ref 150–400)
RBC: 4.37 MIL/uL (ref 3.87–5.11)
RDW: 12.5 % (ref 11.5–15.5)
WBC: 5.6 10*3/uL (ref 4.0–10.5)

## 2016-12-10 LAB — BASIC METABOLIC PANEL
Anion gap: 7 (ref 5–15)
BUN: 16 mg/dL (ref 6–20)
CHLORIDE: 106 mmol/L (ref 101–111)
CO2: 25 mmol/L (ref 22–32)
CREATININE: 0.98 mg/dL (ref 0.44–1.00)
Calcium: 9.1 mg/dL (ref 8.9–10.3)
GFR calc Af Amer: >60 mL/min (ref >60)
GFR calc non Af Amer: >60 mL/min (ref >60)
GLUCOSE: 122 mg/dL — AB (ref 65–99)
POTASSIUM: 3.8 mmol/L (ref 3.5–5.1)
SODIUM: 138 mmol/L (ref 135–145)

## 2016-12-10 LAB — HCG, SERUM, QUALITATIVE: Preg, Serum: NEGATIVE

## 2016-12-14 ENCOUNTER — Encounter (HOSPITAL_COMMUNITY): Payer: Self-pay | Admitting: *Deleted

## 2016-12-14 ENCOUNTER — Ambulatory Visit (HOSPITAL_COMMUNITY): Payer: 59 | Admitting: Anesthesiology

## 2016-12-14 ENCOUNTER — Encounter (HOSPITAL_COMMUNITY)
Admission: RE | Disposition: A | Discharge status: Home / Self Care | Payer: Self-pay | Source: Ambulatory Visit | Attending: General Surgery

## 2016-12-14 ENCOUNTER — Ambulatory Visit (HOSPITAL_COMMUNITY)
Admission: RE | Admit: 2016-12-14 | Discharge: 2016-12-14 | Disposition: A | Discharge status: Home / Self Care | Payer: 59 | Source: Ambulatory Visit | Attending: General Surgery | Admitting: General Surgery

## 2016-12-14 DIAGNOSIS — I10 Essential (primary) hypertension: Secondary | ICD-10-CM | POA: Insufficient documentation

## 2016-12-14 DIAGNOSIS — L723 Sebaceous cyst: Secondary | ICD-10-CM | POA: Insufficient documentation

## 2016-12-14 HISTORY — PX: BREAST CYST EXCISION: SHX579

## 2016-12-14 HISTORY — PX: MASS EXCISION: SHX2000

## 2016-12-14 SURGERY — EXCISION MASS
Anesthesia: General | Site: Axilla | Laterality: Right

## 2016-12-14 MED ORDER — CHLORHEXIDINE GLUCONATE CLOTH 2 % EX PADS: 6.0000 | MEDICATED_PAD | Freq: Once | CUTANEOUS | Status: DC

## 2016-12-14 MED ORDER — KETOROLAC TROMETHAMINE 30 MG/ML IJ SOLN
30.0000 mg | Freq: Once | INTRAMUSCULAR | Status: AC
  Administered 2016-12-14: 30 mg via INTRAVENOUS
  Filled 2016-12-14: qty 1

## 2016-12-14 MED ORDER — FENTANYL CITRATE (PF) 100 MCG/2ML IJ SOLN
INTRAMUSCULAR | Status: AC
  Filled 2016-12-14: qty 2

## 2016-12-14 MED ORDER — CEFAZOLIN SODIUM-DEXTROSE 2-4 GM/100ML-% IV SOLN
2.0000 g | INTRAVENOUS | Status: AC
  Administered 2016-12-14: 2 g via INTRAVENOUS
  Filled 2016-12-14: qty 100

## 2016-12-14 MED ORDER — LACTATED RINGERS IV SOLN
INTRAVENOUS | Status: DC
  Administered 2016-12-14: 07:00:00 via INTRAVENOUS

## 2016-12-14 MED ORDER — FENTANYL CITRATE (PF) 100 MCG/2ML IJ SOLN: 25.0000 ug | INTRAMUSCULAR | Status: DC | PRN

## 2016-12-14 MED ORDER — MIDAZOLAM HCL 2 MG/2ML IJ SOLN
0.5000 mg | INTRAMUSCULAR | Status: DC | PRN
  Administered 2016-12-14: 2 mg via INTRAVENOUS

## 2016-12-14 MED ORDER — PROPOFOL 10 MG/ML IV BOLUS
INTRAVENOUS | Status: DC | PRN
  Administered 2016-12-14: 150 mg via INTRAVENOUS

## 2016-12-14 MED ORDER — 0.9 % SODIUM CHLORIDE (POUR BTL) OPTIME
TOPICAL | Status: DC | PRN
  Administered 2016-12-14: 1000 mL

## 2016-12-14 MED ORDER — ONDANSETRON HCL 4 MG/2ML IJ SOLN
4.0000 mg | Freq: Once | INTRAMUSCULAR | Status: AC
  Administered 2016-12-14: 4 mg via INTRAVENOUS

## 2016-12-14 MED ORDER — LIDOCAINE HCL (PF) 1 % IJ SOLN
INTRAMUSCULAR | Status: AC
  Filled 2016-12-14: qty 5

## 2016-12-14 MED ORDER — ONDANSETRON HCL 4 MG/2ML IJ SOLN
INTRAMUSCULAR | Status: AC
  Filled 2016-12-14: qty 2

## 2016-12-14 MED ORDER — LIDOCAINE HCL (CARDIAC) 10 MG/ML IV SOLN
INTRAVENOUS | Status: DC | PRN
  Administered 2016-12-14: 50 mg via INTRAVENOUS

## 2016-12-14 MED ORDER — BUPIVACAINE HCL (PF) 0.5 % IJ SOLN
INTRAMUSCULAR | Status: DC | PRN
  Administered 2016-12-14: 5 mL

## 2016-12-14 MED ORDER — BUPIVACAINE HCL (PF) 0.5 % IJ SOLN
INTRAMUSCULAR | Status: AC
  Filled 2016-12-14: qty 30

## 2016-12-14 MED ORDER — PROPOFOL 10 MG/ML IV BOLUS
INTRAVENOUS | Status: AC
  Filled 2016-12-14: qty 40

## 2016-12-14 MED ORDER — ROCURONIUM BROMIDE 50 MG/5ML IV SOLN
INTRAVENOUS | Status: AC
  Filled 2016-12-14: qty 1

## 2016-12-14 MED ORDER — FENTANYL CITRATE (PF) 100 MCG/2ML IJ SOLN
INTRAMUSCULAR | Status: DC | PRN
Start: 2016-12-14 — End: 2016-12-14
  Administered 2016-12-14 (×2): 50 ug via INTRAVENOUS

## 2016-12-14 MED ORDER — MIDAZOLAM HCL 2 MG/2ML IJ SOLN
INTRAMUSCULAR | Status: AC
  Filled 2016-12-14: qty 2

## 2016-12-14 MED ORDER — HYDROCODONE-ACETAMINOPHEN 5-325 MG PO TABS: 1.0000 | ORAL_TABLET | Freq: Four times a day (QID) | ORAL | 0 refills | Status: DC | PRN

## 2016-12-14 SURGICAL SUPPLY — 42 items
ADH SKN CLS APL DERMABOND .7 (GAUZE/BANDAGES/DRESSINGS) ×1
BAG HAMPER (MISCELLANEOUS) ×3 IMPLANT
BLADE SURG SZ11 CARB STEEL (BLADE) IMPLANT
CHLORAPREP W/TINT 10.5 ML (MISCELLANEOUS) ×3 IMPLANT
CLOTH BEACON ORANGE TIMEOUT ST (SAFETY) ×3 IMPLANT
COVER LIGHT HANDLE STERIS (MISCELLANEOUS) ×6 IMPLANT
DECANTER SPIKE VIAL GLASS SM (MISCELLANEOUS) ×3 IMPLANT
DERMABOND ADVANCED (GAUZE/BANDAGES/DRESSINGS) ×2
DERMABOND ADVANCED .7 DNX12 (GAUZE/BANDAGES/DRESSINGS) IMPLANT
DRAPE EENT ADH APERT 31X51 STR (DRAPES) IMPLANT
ELECT NDL TIP 2.8 STRL (NEEDLE) IMPLANT
ELECT NEEDLE TIP 2.8 STRL (NEEDLE) IMPLANT
ELECT REM PT RETURN 9FT ADLT (ELECTROSURGICAL) ×3
ELECTRODE REM PT RTRN 9FT ADLT (ELECTROSURGICAL) ×1 IMPLANT
FORMALIN 10 PREFIL 120ML (MISCELLANEOUS) ×3 IMPLANT
GLOVE BIOGEL PI IND STRL 6.5 (GLOVE) IMPLANT
GLOVE BIOGEL PI IND STRL 7.0 (GLOVE) ×1 IMPLANT
GLOVE BIOGEL PI INDICATOR 6.5 (GLOVE) ×2
GLOVE BIOGEL PI INDICATOR 7.0 (GLOVE) ×2
GLOVE EXAM NITRILE MD LF STRL (GLOVE) ×2 IMPLANT
GLOVE SURG SS PI 6.5 STRL IVOR (GLOVE) ×2 IMPLANT
GLOVE SURG SS PI 7.5 STRL IVOR (GLOVE) ×3 IMPLANT
GOWN STRL REUS W/ TWL XL LVL3 (GOWN DISPOSABLE) ×1 IMPLANT
GOWN STRL REUS W/TWL LRG LVL3 (GOWN DISPOSABLE) ×3 IMPLANT
GOWN STRL REUS W/TWL XL LVL3 (GOWN DISPOSABLE) ×3
KIT ROOM TURNOVER APOR (KITS) ×3 IMPLANT
MANIFOLD NEPTUNE II (INSTRUMENTS) ×3 IMPLANT
NDL 22X1.5 STRL (OR ONLY) (MISCELLANEOUS) IMPLANT
NDL HYPO 25X1 1.5 SAFETY (NEEDLE) ×1 IMPLANT
NEEDLE 22X1 1/2 OR ONLY (MISCELLANEOUS)
NEEDLE 22X1.5 STRL (OR ONLY) (MISCELLANEOUS) IMPLANT
NEEDLE HYPO 25X1 1.5 SAFETY (NEEDLE) ×3 IMPLANT
NS IRRIG 1000ML POUR BTL (IV SOLUTION) ×3 IMPLANT
PACK MINOR (CUSTOM PROCEDURE TRAY) IMPLANT
PAD ARMBOARD 7.5X6 YLW CONV (MISCELLANEOUS) ×3 IMPLANT
SET BASIN LINEN APH (SET/KITS/TRAYS/PACK) ×3 IMPLANT
SUT ETHILON 3 0 FSL (SUTURE) IMPLANT
SUT PROLENE 4 0 PS 2 18 (SUTURE) IMPLANT
SUT VIC AB 3-0 SH 27 (SUTURE)
SUT VIC AB 3-0 SH 27X BRD (SUTURE) IMPLANT
SUT VIC AB 4-0 PS2 27 (SUTURE) ×2 IMPLANT
SYR CONTROL 10ML LL (SYRINGE) ×3 IMPLANT

## 2016-12-14 NOTE — Anesthesia Postprocedure Evaluation (Signed)
Anesthesia Post Note  Patient: Diamond Le  Procedure(s) Performed: Procedure(s) (LRB): EXCISION CYST RIGHT AXILLA (Right)  Patient location during evaluation: PACU Anesthesia Type: General Level of consciousness: awake and alert Pain management: pain level controlled Vital Signs Assessment: post-procedure vital signs reviewed and stable Respiratory status: spontaneous breathing Cardiovascular status: stable Anesthetic complications: no     Last Vitals:  Vitals:   12/14/16 0845 12/14/16 0901  BP: 115/72 113/68  Pulse: 62 64  Resp: 17 16  Temp:  36.4 C    Last Pain:  Vitals:   12/14/16 0901  TempSrc: Oral                 Nasim Habeeb

## 2016-12-14 NOTE — Op Note (Signed)
Patient:  Kennedy R Tsao  DOB:  December 12, 1965  MRN:  [id number]   Preop Diagnosis:  Sebaceous cyst, right axilla  Postop Diagnosis:  Same  Procedure:  Excision of 2 cm sebaceous cyst, right axilla  Surgeon:  Aviva Signs, M.D.  Anes:  Gen.  Indications:  Patient is a 51 year old black female who presents with an enlarging tender sebaceous cyst in the right axilla. The risks and benefits of the procedure including bleeding, infection, and recurrence of the cyst were fully explained to the patient, who gave informed consent.  Procedure note:  The patient was placed in supine position. After general anesthesia was administered, the right axilla was prepped and draped using the usual sterile technique with DuraPrep. Surgical site confirmation was performed.  An incision was made in the right axilla over the cyst. The cyst was fully excised without difficulty. The was disposed of. The bleeding was controlled using Bovie electrocautery. 0.5% Sensorcaine was instilled into the surrounding wound. The skin was closed using a 4-0 Vicryl subcuticular suture. Dermabond was applied.  All tape and needle counts were correct at the end of the procedure. The patient was awakened and transferred to PACU in stable condition.  Complications:  None  EBL:  Minimal  Specimen:  None

## 2016-12-14 NOTE — Discharge Instructions (Signed)
Epidermal Cyst Removal, Care After °Introduction °Refer to this sheet in the next few weeks. These instructions provide you with information about caring for yourself after your procedure. Your health care provider may also give you more specific instructions. Your treatment has been planned according to current medical practices, but problems sometimes occur. Call your health care provider if you have any problems or questions after your procedure. °What can I expect after the procedure? °After the procedure, it is common to have: °· Soreness in the area where your cyst was removed. °· Tightness or itching from your skin sutures. °Follow these instructions at home: °· Take medicines only as directed by your health care provider. °· If you were prescribed an antibiotic medicine, finish all of it even if you start to feel better. °· Use antibiotic ointment as directed by your health care provider. Follow the instructions carefully. °· There are many different ways to close and cover an incision, including stitches (sutures), skin glue, and adhesive strips. Follow your health care provider's instructions about: °¨ Incision care. °¨ Bandage (dressing) changes and removal. °¨ Incision closure removal. °· Keep the bandage (dressing) dry until your health care provider says that it can be removed. Take sponge baths only. Ask your health care provider when you can start showering or taking a bath. °· After your dressing is off, check your incision every day for signs of infection. Watch for: °¨ Redness, swelling, or pain. °¨ Fluid, blood, or pus. °· You can return to your normal activities. Do not do anything that stretches or puts pressure on your incision. °· You can return to your normal diet. °· Keep all follow-up visits as directed by your health care provider. This is important. °Contact a health care provider if: °· You have a fever. °· Your incision bleeds. °· You have redness, swelling, or pain in the incision  area. °· You have fluid, blood, or pus coming from your incision. °· Your cyst comes back after surgery. °This information is not intended to replace advice given to you by your health care provider. Make sure you discuss any questions you have with your health care provider. °Document Released: 12/03/2014 Document Revised: 04/19/2016 Document Reviewed: 07/28/2014 °© 2017 Elsevier ° °

## 2016-12-14 NOTE — Transfer of Care (Signed)
Immediate Anesthesia Transfer of Care Note  Patient: Diamond Le  Procedure(s) Performed: Procedure(s): EXCISION CYST RIGHT AXILLA (Right)  Patient Location: PACU  Anesthesia Type:General  Level of Consciousness: awake and patient cooperative  Airway & Oxygen Therapy: Patient Spontanous Breathing and non-rebreather face mask  Post-op Assessment: Report given to RN and Post -op Vital signs reviewed and stable  Post vital signs: Reviewed and stable  Last Vitals:  Vitals:   12/14/16 0720 12/14/16 0725  BP: 132/79   Pulse:    Resp: (!) 26 20  Temp:      Last Pain:  Vitals:   12/14/16 0645  TempSrc: Oral      Patients Stated Pain Goal: 9 ([id number])  Complications: No apparent anesthesia complications

## 2016-12-14 NOTE — Anesthesia Procedure Notes (Signed)
Procedure Name: LMA Insertion Date/Time: 12/14/2016 7:38 AM Performed by: Vista Deck Pre-anesthesia Checklist: Patient identified, Patient being monitored, Emergency Drugs available, Timeout performed and Suction available Patient Re-evaluated:Patient Re-evaluated prior to inductionOxygen Delivery Method: Circle System Utilized Preoxygenation: Pre-oxygenation with 100% oxygen Intubation Type: IV induction Ventilation: Mask ventilation without difficulty LMA: LMA inserted LMA Size: 4.0 Number of attempts: 1 Placement Confirmation: positive ETCO2 and breath sounds checked- equal and bilateral Tube secured with: Tape

## 2016-12-14 NOTE — Interval H&P Note (Signed)
History and Physical Interval Note:  12/14/2016 7:15 AM  Diamond Le  has presented today for surgery, with the diagnosis of 2cm sebaceous cyt  The various methods of treatment have been discussed with the patient and family. After consideration of risks, benefits and other options for treatment, the patient has consented to  Procedure(s): EXCISION 2CM CYST RIGHT AXILLA (Right) as a surgical intervention .  The patient's history has been reviewed, patient examined, no change in status, stable for surgery.  I have reviewed the patient's chart and labs.  Questions were answered to the patient's satisfaction.     Aviva Signs A

## 2016-12-14 NOTE — Anesthesia Preprocedure Evaluation (Addendum)
Anesthesia Evaluation  Patient identified by MRN, date of birth, ID band Patient awake    Reviewed: Allergy & Precautions, NPO status , Patient's Chart, lab work & pertinent test results  Airway Mallampati: III  TM Distance: >3 FB     Dental  (+) Teeth Intact   Pulmonary neg pulmonary ROS,    breath sounds clear to auscultation       Cardiovascular hypertension, Pt. on medications  Rhythm:Regular Rate:Normal     Neuro/Psych    GI/Hepatic neg GERD  ,  Endo/Other    Renal/GU      Musculoskeletal   Abdominal   Peds  Hematology   Anesthesia Other Findings   Reproductive/Obstetrics                             Anesthesia Physical Anesthesia Plan  ASA: II  Anesthesia Plan: General   Post-op Pain Management:    Induction: Intravenous  Airway Management Planned: LMA  Additional Equipment:   Intra-op Plan:   Post-operative Plan: Extubation in OR  Informed Consent: I have reviewed the patients History and Physical, chart, labs and discussed the procedure including the risks, benefits and alternatives for the proposed anesthesia with the patient or authorized representative who has indicated his/her understanding and acceptance.     Plan Discussed with:   Anesthesia Plan Comments:         Anesthesia Quick Evaluation

## 2016-12-18 ENCOUNTER — Encounter (HOSPITAL_COMMUNITY): Payer: Self-pay | Admitting: General Surgery

## 2017-03-27 ENCOUNTER — Encounter: Payer: Managed Care, Other (non HMO) | Admitting: Family Medicine

## 2017-05-09 ENCOUNTER — Ambulatory Visit (INDEPENDENT_AMBULATORY_CARE_PROVIDER_SITE_OTHER): Payer: Self-pay | Admitting: Family Medicine

## 2017-05-09 ENCOUNTER — Encounter: Payer: Self-pay | Admitting: Family Medicine

## 2017-05-09 VITALS — BP 148/100 | HR 78 | Resp 16 | Ht 65.0 in | Wt 225.0 lb

## 2017-05-09 DIAGNOSIS — I10 Essential (primary) hypertension: Secondary | ICD-10-CM

## 2017-05-09 DIAGNOSIS — Z020 Encounter for examination for admission to educational institution: Secondary | ICD-10-CM

## 2017-05-09 MED ORDER — SPIRONOLACTONE 25 MG PO TABS: 25.0000 mg | ORAL_TABLET | Freq: Every day | ORAL | 6 refills | Status: DC

## 2017-05-09 MED ORDER — AMLODIPINE BESYLATE 10 MG PO TABS: 10.0000 mg | ORAL_TABLET | Freq: Every day | ORAL | 6 refills | Status: DC

## 2017-05-09 NOTE — Patient Instructions (Signed)
Nurse visit for BP check on July 9 an to collect form  MD follow in 6 monhts  Need fastinf chem 7 done on July 5  Please work on good  health habits so that your health will improve. 1. Commitment to daily physical activity for 30 to 60  minutes, if you are able to do this.  2. Commitment to wise food choices. Aim for half of your  food intake to be vegetable and fruit, one quarter starchy foods, and one quarter protein. Try to eat on a regular schedule  3 meals per day, snacking between meals should be limited to vegetables or fruits or small portions of nuts. 64 ounces of water per day is generally recommended, unless you have specific health conditions, like heart failure or kidney failure where you will need to limit fluid intake.  3. Commitment to sufficient and a  good quality of physical and mental rest daily, generally between 6 to 8 hours per day.  WITH PERSISTANCE AND PERSEVERANCE, THE IMPOSSIBLE , BECOMES THE NORM! Thank you  for choosing [facility]. We consider it a privelige to serve you.  Delivering excellent health care in a caring and  compassionate way is our goal.  Partnering with you,  so that together we can achieve this goal is our strategy.

## 2017-05-10 ENCOUNTER — Encounter: Payer: Self-pay | Admitting: Family Medicine

## 2017-05-10 DIAGNOSIS — Z020 Encounter for examination for admission to educational institution: Secondary | ICD-10-CM | POA: Insufficient documentation

## 2017-05-10 NOTE — Progress Notes (Signed)
    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    BP (!) 148/100   Pulse 78   Resp 16   Ht 5\' 5"  (1.651 m)   Wt 225 lb (102.1 kg)   LMP 05/07/2017 (Exact Date)   SpO2 100%   BMI 37.44 kg/m   Pleasant  female, alert and oriented x 3, in no cardio-pulmonary distress. Afebrile. HEENT No facial trauma or asymetry. Sinuses non tender.  Extra occullar muscles intact,  External ears normal, tympanic membranes clear. Oropharynx moist, no exudate. Neck: supple, no adenopathy,JVD or thyromegaly.No bruits.  Chest: Clear to ascultation bilaterally.No crackles or wheezes. Non tender to palpation  Breast: No asymetry,no masses or lumps. No tenderness. No nipple discharge or inversion. No axillary or supraclavicular adenopathy  Cardiovascular system; Heart sounds normal,  S1 and  S2 ,no S3.  No murmur, or thrill. Apical beat not displaced Peripheral pulses normal.  Abdomen: Soft, non tender, no organomegaly or masses. No bruits. Bowel sounds normal. No guarding, tenderness or rebound.  Rectal:  deferred  GU: Deferred has exam with gyne saand reports currently on menses. .   Musculoskeletal exam: Full ROM of spine, hips , shoulders and knees. No deformity ,swelling or crepitus noted. No muscle wasting or atrophy.   Neurologic: Cranial nerves 2 to 12 intact. Power, tone ,sensation and reflexes normal throughout. No disturbance in gait. No tremor.  Skin: Intact, no ulceration, erythema , scaling or rash noted. Pigmentation normal throughout  Psych; Normal mood and affect. Judgement and concentration normal   Assessment & Plan:  School physical exam Exam as documented omn form Pt returning to school for nursing She will have to return for re evaluation of her blood pressure, she is hypertensive and has been off medication for  over 2 months, is uninsured currently  Obesity, morbid ([place]) Deteriorated. Patient re-educated about  the importance of commitment to a  minimum of 150 minutes of exercise per week.  The importance of healthy food choices with portion control discussed. Encouraged to start a food diary, count calories and to consider  joining a support group. Sample diet sheets offered. Goals set by the patient for the next several months.   Weight /BMI 05/09/2017 12/14/2016 12/10/2016  WEIGHT 225 lb 223 lb 223 lb  HEIGHT 5\' 5"  5\' 5"  5\' 5"   BMI 37.44 kg/m2 37.11 kg/m2 37.11 kg/m2      Essential hypertension Uncontrolled due to med non adherence, importance of same I s stressed . Return in 3 weeks for re eval DASH diet and commitment to daily physical activity for a minimum of 30 minutes discussed and encouraged, as a part of hypertension management. The importance of attaining a healthy weight is also discussed.  BP/Weight 05/09/2017 12/14/2016 12/10/2016 10/23/2016 05/08/2016 01/25/2016 59/16/3846  Systolic BP 659 935 701 779 390 300 923  Diastolic BP 300 68 69 80 90 90 100  Wt. (Lbs) 225 223 223 216 216.5 218 215  BMI 37.44 37.11 37.11 35.94 36.03 36.28 34.72

## 2017-05-10 NOTE — Assessment & Plan Note (Signed)
Deteriorated. Patient re-educated about  the importance of commitment to a  minimum of 150 minutes of exercise per week.  The importance of healthy food choices with portion control discussed. Encouraged to start a food diary, count calories and to consider  joining a support group. Sample diet sheets offered. Goals set by the patient for the next several months.   Weight /BMI 05/09/2017 12/14/2016 12/10/2016  WEIGHT 225 lb 223 lb 223 lb  HEIGHT 5\' 5"  5\' 5"  5\' 5"   BMI 37.44 kg/m2 37.11 kg/m2 37.11 kg/m2

## 2017-05-10 NOTE — Assessment & Plan Note (Signed)
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

## 2017-05-10 NOTE — Assessment & Plan Note (Signed)
Uncontrolled due to med non adherence, importance of same I s stressed . Return in 3 weeks for re eval DASH diet and commitment to daily physical activity for a minimum of 30 minutes discussed and encouraged, as a part of hypertension management. The importance of attaining a healthy weight is also discussed.  BP/Weight 05/09/2017 12/14/2016 12/10/2016 10/23/2016 05/08/2016 01/25/2016 81/44/8185  Systolic BP 631 497 026 378 588 502 774  Diastolic BP 128 68 69 80 90 90 100  Wt. (Lbs) 225 223 223 216 216.5 218 215  BMI 37.44 37.11 37.11 35.94 36.03 36.28 34.72

## 2017-05-31 LAB — BASIC METABOLIC PANEL WITH GFR
BUN: 12 mg/dL (ref 7–25)
CO2: 28 mmol/L (ref 20–31)
Calcium: 9.2 mg/dL (ref 8.6–10.2)
Chloride: 104 mmol/L (ref 98–110)
Creat: 0.92 mg/dL (ref 0.50–1.10)
GFR, EST AFRICAN AMERICAN: 85 mL/min (ref >=60)
GFR, EST NON AFRICAN AMERICAN: 74 mL/min (ref >=60)
GLUCOSE: 84 mg/dL (ref 65–99)
POTASSIUM: 3.9 mmol/L (ref 3.5–5.3)
Sodium: 138 mmol/L (ref 135–146)

## 2017-06-03 ENCOUNTER — Ambulatory Visit: Payer: Self-pay

## 2017-06-03 VITALS — BP 134/82 | HR 80

## 2017-06-03 DIAGNOSIS — I10 Essential (primary) hypertension: Secondary | ICD-10-CM

## 2017-12-25 ENCOUNTER — Other Ambulatory Visit: Payer: Self-pay | Admitting: Family Medicine

## 2018-02-13 ENCOUNTER — Ambulatory Visit: Payer: Self-pay | Admitting: Family Medicine

## 2018-04-07 ENCOUNTER — Other Ambulatory Visit: Payer: Self-pay | Admitting: Family Medicine

## 2018-05-20 ENCOUNTER — Other Ambulatory Visit: Payer: 59 | Admitting: Adult Health

## 2018-08-12 ENCOUNTER — Other Ambulatory Visit: Payer: Self-pay | Admitting: Family Medicine

## 2018-08-18 ENCOUNTER — Other Ambulatory Visit: Payer: Self-pay | Admitting: Family Medicine

## 2018-09-20 ENCOUNTER — Other Ambulatory Visit: Payer: Self-pay | Admitting: Family Medicine

## 2018-11-04 ENCOUNTER — Other Ambulatory Visit: Payer: Self-pay | Admitting: Family Medicine

## 2018-11-05 ENCOUNTER — Other Ambulatory Visit: Payer: 59 | Admitting: Adult Health

## 2018-12-10 ENCOUNTER — Other Ambulatory Visit (HOSPITAL_COMMUNITY)
Admission: RE | Admit: 2018-12-10 | Discharge: 2018-12-10 | Disposition: A | Discharge status: Home / Self Care | Payer: Commercial Managed Care - PPO | Source: Ambulatory Visit | Attending: Adult Health | Admitting: Adult Health

## 2018-12-10 ENCOUNTER — Encounter (INDEPENDENT_AMBULATORY_CARE_PROVIDER_SITE_OTHER): Payer: Self-pay | Admitting: *Deleted

## 2018-12-10 ENCOUNTER — Ambulatory Visit (INDEPENDENT_AMBULATORY_CARE_PROVIDER_SITE_OTHER): Payer: Commercial Managed Care - PPO | Admitting: Adult Health

## 2018-12-10 ENCOUNTER — Encounter: Payer: Self-pay | Admitting: Adult Health

## 2018-12-10 VITALS — BP 130/75 | HR 81 | Ht 65.0 in | Wt 229.5 lb

## 2018-12-10 DIAGNOSIS — B372 Candidiasis of skin and nail: Secondary | ICD-10-CM

## 2018-12-10 DIAGNOSIS — Z1212 Encounter for screening for malignant neoplasm of rectum: Secondary | ICD-10-CM | POA: Diagnosis not present

## 2018-12-10 DIAGNOSIS — Z1211 Encounter for screening for malignant neoplasm of colon: Secondary | ICD-10-CM

## 2018-12-10 DIAGNOSIS — Z01419 Encounter for gynecological examination (general) (routine) without abnormal findings: Secondary | ICD-10-CM | POA: Diagnosis not present

## 2018-12-10 LAB — HEMOCCULT GUIAC POC 1CARD (OFFICE): FECAL OCCULT BLD: NEGATIVE

## 2018-12-10 MED ORDER — FLUCONAZOLE 150 MG PO TABS: ORAL_TABLET | ORAL | 6 refills | Status: DC

## 2018-12-10 NOTE — Progress Notes (Signed)
Patient ID: Diamond Le, female   DOB: November 30, 1965, 53 y.o.   MRN: [id number] History of Present Illness:  Diamond Le is a 53 year old black female, married, G2P2, in for well woman gyn exam and pap. 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  PCP is Dr Moshe Cipro.   Current Medications, Allergies, Past Medical History, Past Surgical History, Family History and Social History were reviewed in [employer] record.     Review of Systems:  Patient denies any headaches, hearing loss, fatigue, blurred vision, shortness of breath, chest pain, abdominal pain, problems with bowel movements, urination, or intercourse. No joint pain or mood swings. Periods still regular, no hot flashes. Has itching if groin area before periods.    Physical Exam:BP 130/75 (BP Location: Left Arm, Patient Position: Sitting, Cuff Size: Large)   Pulse 81   Ht 5\' 5"  (1.651 m)   Wt 229 lb 8 oz (104.1 kg)   LMP 12/02/2018 (Approximate)   BMI 38.19 kg/m  General:  Well developed, well nourished, no acute distress Skin:  Warm and dry Neck:  Midline trachea, normal thyroid, good ROM, no lymphadenopathy Lungs; Clear to auscultation bilaterally Breast:  No dominant palpable mass, retraction, or nipple discharge Cardiovascular: Regular rate and rhythm Abdomen:  Soft, non tender, no hepatosplenomegaly Pelvic:  External genitalia is normal in appearance, no lesions,but some discoloration in groin.  The vagina is normal in appearance. Urethra has no lesions or masses. The cervix is bulbous. Pap with HPV performed. Uterus is felt to be normal size, shape, and contour.  No adnexal masses or tenderness noted.Bladder is non tender, no masses felt. Rectal: Good sphincter tone, no polyps, or hemorrhoids felt.  Hemoccult negative. Extremities/musculoskeletal:  No swelling or varicosities noted, no clubbing or cyanosis Psych:  No mood changes, alert and cooperative,seems happy PHQ 2 score 0 Fall risk  is low. Examination chaperoned by Levy Pupa LPN.  Will try diflucan before period to see if helps.  Impression: 1. Encounter for gynecological examination with Papanicolaou smear of cervix   2. Screening for colorectal cancer   3. Skin yeast infection       Plan: Meds ordered this encounter  Medications  . fluconazole (DIFLUCAN) 150 MG tablet    Sig: Take 1 before period when has itching    Dispense:  2 tablet    Refill:  6    Order Specific Question:   Supervising Provider    Answer:   Florian Buff [[id number]]   Get mammogram now  F/U with Dr Moshe Cipro for labs Referred to Dr Laural Golden for screening colonoscopy

## 2018-12-12 LAB — CYTOLOGY - PAP
DIAGNOSIS: NEGATIVE
HPV (WINDOPATH): NOT DETECTED

## 2019-01-14 ENCOUNTER — Encounter: Payer: Self-pay | Admitting: Family Medicine

## 2019-01-14 ENCOUNTER — Ambulatory Visit: Payer: Commercial Managed Care - PPO | Admitting: Family Medicine

## 2019-01-14 VITALS — BP 129/83 | HR 84 | Resp 14 | Ht 65.0 in | Wt 229.0 lb

## 2019-01-14 DIAGNOSIS — Z1231 Encounter for screening mammogram for malignant neoplasm of breast: Secondary | ICD-10-CM | POA: Diagnosis not present

## 2019-01-14 DIAGNOSIS — E559 Vitamin D deficiency, unspecified: Secondary | ICD-10-CM | POA: Diagnosis not present

## 2019-01-14 DIAGNOSIS — I10 Essential (primary) hypertension: Secondary | ICD-10-CM

## 2019-01-14 NOTE — Assessment & Plan Note (Signed)
Controlled, no change in medication DASH diet and commitment to daily physical activity for a minimum of 30 minutes discussed and encouraged, as a part of hypertension management. The importance of attaining a healthy weight is also discussed.  BP/Weight 01/14/2019 12/10/2018 06/03/2017 05/09/2017 12/14/2016 12/10/2016 75/79/7282  Systolic BP 060 156 153 794 327 614 709  Diastolic BP 83 75 82 295 68 69 80  Wt. (Lbs) 229 229.5 - 225 223 223 216  BMI 38.11 38.19 - 37.44 37.11 37.11 35.94

## 2019-01-14 NOTE — Assessment & Plan Note (Signed)
Updated lab needed at/ before next visit.   

## 2019-01-14 NOTE — Progress Notes (Signed)
   Diamond Le     MRN: [id number]      DOB: January 02, 1966   HPI Diamond Le is here for follow up and re-evaluation of chronic medical conditions, medication management and review of any available recent lab and radiology data.  Preventive health is updated, specifically  Cancer screening and Immunization.   Questions or concerns regarding consultations or procedures which the PT has had in the interim are  addressed. The PT denies any adverse reactions to current medications since the last visit.  There are no new concerns.  C/o poor health habits, lack of exercise and weight gain, knows she needs to change all of this  ROS Denies recent fever or chills. Denies sinus pressure, nasal congestion, ear pain or sore throat. Denies chest congestion, productive cough or wheezing. Denies chest pains, palpitations and leg swelling Denies abdominal pain, nausea, vomiting,diarrhea or constipation.   Denies dysuria, frequency, hesitancy or incontinence. Denies joint pain, swelling and limitation in mobility. Denies headaches, seizures, numbness, or tingling. Denies depression, anxiety or insomnia. Denies skin break down or rash.   PE  BP 129/83   Pulse 84   Resp 14   Ht 5\' 5"  (1.651 m)   Wt 229 lb (103.9 kg)   SpO2 98% Comment: room air  BMI 38.11 kg/m   Patient alert and oriented and in no cardiopulmonary distress.  HEENT: No facial asymmetry, EOMI,   oropharynx pink and moist.  Neck supple no JVD, no mass.  Chest: Clear to auscultation bilaterally.  CVS: S1, S2 no murmurs, no S3.Regular rate.  ABD: Soft non tender.   Ext: No edema  MS: Adequate ROM spine, shoulders, hips and knees.  Skin: Intact, no ulcerations or rash noted.  Psych: Good eye contact, normal affect. Memory intact not anxious or depressed appearing.  CNS: CN 2-12 intact, power,  normal throughout.no focal deficits noted.   Assessment & Plan  Essential hypertension Controlled, no change in  medication DASH diet and commitment to daily physical activity for a minimum of 30 minutes discussed and encouraged, as a part of hypertension management. The importance of attaining a healthy weight is also discussed.  BP/Weight 01/14/2019 12/10/2018 06/03/2017 05/09/2017 12/14/2016 12/10/2016 94/49/6759  Systolic BP 163 846 659 935 701 779 390  Diastolic BP 83 75 82 300 68 69 80  Wt. (Lbs) 229 229.5 - 225 223 223 216  BMI 38.11 38.19 - 37.44 37.11 37.11 35.94       Obesity, morbid ([facility]) Deteriorated. 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  The importance of healthy food choices with portion control discussed, as well as eating regularly and within a 12 hour window most days. The need to choose "clean , green" food 50 to 75% of the time is discussed, as well as to make water the primary drink and set a goal of 64 ounces water daily.  Encouraged to start a food diary,  and to consider  joining a support group. Sample diet sheets offered. Goals set by the patient for the next several months.   Weight /BMI 01/14/2019 12/10/2018 05/09/2017  WEIGHT 229 lb 229 lb 8 oz 225 lb  HEIGHT 5\' 5"  5\' 5"  5\' 5"   BMI 38.11 kg/m2 38.19 kg/m2 37.44 kg/m2      Vitamin D deficiency Updated lab needed at/ before next visit.

## 2019-01-14 NOTE — Patient Instructions (Addendum)
F/Uin 6 months, call if you need me before  Pls assist with "My Chart" log in  Please schedule mammogram at checkout on a Wednesday   CBC,. Lipid, cmp and EGFr, TSH, vit D, HBA1C  This week ([facility])  BP is excellent   Mindful EATING Think about what you will eat, plan ahead. Choose " clean, green, fresh or frozen" over canned, processed or packaged foods which are more sugary, salty and fatty. 70 to 75% of food eaten should be vegetables and fruit. Three meals at set times with snacks allowed between meals, but they must be fruit or vegetables. Aim to eat over a 12 hour period , example 7 am to 7 pm, and STOP after  your last meal of the day. Drink water,generally about 64 ounces per day, no other drink is as healthy. Fruit juice is best enjoyed in a healthy way, by EATING the fruit.  It is important that you exercise regularly at least 30 minutes 5 times a week. If you develop chest pain, have severe difficulty breathing, or feel very tired, stop exercising immediately and seek medical attention   Weight loss goal of 12 to 15  pounds

## 2019-01-14 NOTE — Assessment & Plan Note (Signed)
Deteriorated. 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  The importance of healthy food choices with portion control discussed, as well as eating regularly and within a 12 hour window most days. The need to choose "clean , green" food 50 to 75% of the time is discussed, as well as to make water the primary drink and set a goal of 64 ounces water daily.  Encouraged to start a food diary,  and to consider  joining a support group. Sample diet sheets offered. Goals set by the patient for the next several months.   Weight /BMI 01/14/2019 12/10/2018 05/09/2017  WEIGHT 229 lb 229 lb 8 oz 225 lb  HEIGHT 5\' 5"  5\' 5"  5\' 5"   BMI 38.11 kg/m2 38.19 kg/m2 37.44 kg/m2

## 2019-01-15 ENCOUNTER — Other Ambulatory Visit (INDEPENDENT_AMBULATORY_CARE_PROVIDER_SITE_OTHER): Payer: Self-pay | Admitting: *Deleted

## 2019-01-15 DIAGNOSIS — Z1211 Encounter for screening for malignant neoplasm of colon: Secondary | ICD-10-CM | POA: Insufficient documentation

## 2019-01-30 DIAGNOSIS — D539 Nutritional anemia, unspecified: Secondary | ICD-10-CM | POA: Diagnosis not present

## 2019-01-30 DIAGNOSIS — I10 Essential (primary) hypertension: Secondary | ICD-10-CM | POA: Diagnosis not present

## 2019-02-05 LAB — LIPID PANEL
CHOLESTEROL: 147 mg/dL (ref <200)
HDL: 46 mg/dL — AB (ref >=50)
LDL Cholesterol (Calc): 87 mg/dL (calc)
NON-HDL CHOLESTEROL (CALC): 101 mg/dL (ref <130)
TRIGLYCERIDES: 62 mg/dL (ref <150)
Total CHOL/HDL Ratio: 3.2 (calc) (ref <5.0)

## 2019-02-05 LAB — CBC
HEMATOCRIT: 34.7 % — AB (ref 35.0–45.0)
Hemoglobin: 11.5 g/dL — ABNORMAL LOW (ref 11.7–15.5)
MCH: 28.1 pg (ref 27.0–33.0)
MCHC: 33.1 g/dL (ref 32.0–36.0)
MCV: 84.8 fL (ref 80.0–100.0)
MPV: 10.5 fL (ref 7.5–12.5)
Platelets: 330 10*3/uL (ref 140–400)
RBC: 4.09 10*6/uL (ref 3.80–5.10)
RDW: 11.6 % (ref 11.0–15.0)
WBC: 4.4 10*3/uL (ref 3.8–10.8)

## 2019-02-05 LAB — VITAMIN D 25 HYDROXY (VIT D DEFICIENCY, FRACTURES): Vit D, 25-Hydroxy: 17 ng/mL — ABNORMAL LOW (ref 30–100)

## 2019-02-05 LAB — COMPLETE METABOLIC PANEL WITH GFR
AG Ratio: 1.5 (calc) (ref 1.0–2.5)
ALBUMIN MSPROF: 4.1 g/dL (ref 3.6–5.1)
ALKALINE PHOSPHATASE (APISO): 76 U/L (ref 37–153)
ALT: 10 U/L (ref 6–29)
AST: 16 U/L (ref 10–35)
BUN: 9 mg/dL (ref 7–25)
CALCIUM: 9.1 mg/dL (ref 8.6–10.4)
CO2: 28 mmol/L (ref 20–32)
CREATININE: 0.98 mg/dL (ref 0.50–1.05)
Chloride: 106 mmol/L (ref 98–110)
GFR, EST NON AFRICAN AMERICAN: 67 mL/min/{1.73_m2} (ref >=60)
GFR, Est African American: 78 mL/min/{1.73_m2} (ref >=60)
GLOBULIN: 2.7 g/dL (ref 1.9–3.7)
GLUCOSE: 86 mg/dL (ref 65–99)
Potassium: 4 mmol/L (ref 3.5–5.3)
Sodium: 141 mmol/L (ref 135–146)
Total Bilirubin: 0.4 mg/dL (ref 0.2–1.2)
Total Protein: 6.8 g/dL (ref 6.1–8.1)

## 2019-02-05 LAB — IRON: Iron: 57 ug/dL (ref 45–160)

## 2019-02-05 LAB — HEMOGLOBIN A1C
HEMOGLOBIN A1C: 4.9 %{Hb} (ref <5.7)
Mean Plasma Glucose: 94 (calc)
eAG (mmol/L): 5.2 (calc)

## 2019-02-05 LAB — TSH: TSH: 2.4 m[IU]/L

## 2019-02-05 LAB — FERRITIN: Ferritin: 16 ng/mL (ref 16–232)

## 2019-04-27 ENCOUNTER — Encounter (INDEPENDENT_AMBULATORY_CARE_PROVIDER_SITE_OTHER): Payer: Self-pay | Admitting: *Deleted

## 2019-04-27 ENCOUNTER — Telehealth (INDEPENDENT_AMBULATORY_CARE_PROVIDER_SITE_OTHER): Payer: Self-pay | Admitting: *Deleted

## 2019-04-27 MED ORDER — SUPREP BOWEL PREP KIT 17.5-3.13-1.6 GM/177ML PO SOLN: 1.0000 | Freq: Once | ORAL | 0 refills | Status: AC

## 2019-04-27 NOTE — Telephone Encounter (Signed)
Patient needs suprep 

## 2019-04-29 ENCOUNTER — Telehealth (INDEPENDENT_AMBULATORY_CARE_PROVIDER_SITE_OTHER): Payer: Self-pay | Admitting: *Deleted

## 2019-04-29 NOTE — Telephone Encounter (Signed)
Referring MD: jennifer griffin (GYN) PCP: [facility]   Procedure: tcs  Reason/Indication:  screening  Has patient had this procedure before?  no  If so, when, by whom and where?    Is there a family history of colon cancer?  no  Who?  What age when diagnosed?    Is patient diabetic?   no      Does patient have prosthetic heart valve or mechanical valve?  no  Do you have a pacemaker?  no  Has patient ever had endocarditis? no  Has patient had joint replacement within last 12 months?  no  Is patient constipated or do they take laxatives? no  Does patient have a history of alcohol/drug use?  no  Is patient on blood thinner such as Coumadin, Plavix and/or Aspirin? no  Medications: amlodipine 10 mg daily, spironolactone 25 mg daily  Allergies: nkda  Medication Adjustment per Dr Lindi Adie, NP:   Procedure date & time: 05/28/19 at 830

## 2019-04-30 NOTE — Telephone Encounter (Signed)
agree

## 2019-05-25 ENCOUNTER — Other Ambulatory Visit (HOSPITAL_COMMUNITY)
Admission: RE | Admit: 2019-05-25 | Discharge: 2019-05-25 | Disposition: A | Discharge status: Home / Self Care | Payer: Commercial Managed Care - PPO | Source: Ambulatory Visit | Attending: Internal Medicine | Admitting: Internal Medicine

## 2019-05-27 ENCOUNTER — Ambulatory Visit: Payer: Commercial Managed Care - PPO

## 2019-05-28 DIAGNOSIS — Z1211 Encounter for screening for malignant neoplasm of colon: Principal | ICD-10-CM

## 2019-07-08 ENCOUNTER — Other Ambulatory Visit: Payer: Self-pay

## 2019-07-08 ENCOUNTER — Ambulatory Visit
Admission: RE | Admit: 2019-07-08 | Discharge: 2019-07-08 | Disposition: A | Discharge status: Home / Self Care | Payer: Commercial Managed Care - PPO | Source: Ambulatory Visit | Attending: Family Medicine | Admitting: Family Medicine

## 2019-07-08 DIAGNOSIS — Z1231 Encounter for screening mammogram for malignant neoplasm of breast: Secondary | ICD-10-CM

## 2019-07-15 ENCOUNTER — Other Ambulatory Visit: Payer: Self-pay

## 2019-07-15 ENCOUNTER — Ambulatory Visit (INDEPENDENT_AMBULATORY_CARE_PROVIDER_SITE_OTHER): Payer: Commercial Managed Care - PPO | Admitting: Family Medicine

## 2019-07-15 ENCOUNTER — Encounter: Payer: Self-pay | Admitting: Family Medicine

## 2019-07-15 VITALS — BP 130/78 | HR 72 | Ht 65.0 in | Wt 229.0 lb

## 2019-07-15 DIAGNOSIS — D539 Nutritional anemia, unspecified: Secondary | ICD-10-CM

## 2019-07-15 DIAGNOSIS — E559 Vitamin D deficiency, unspecified: Secondary | ICD-10-CM | POA: Diagnosis not present

## 2019-07-15 DIAGNOSIS — E669 Obesity, unspecified: Secondary | ICD-10-CM

## 2019-07-15 DIAGNOSIS — I10 Essential (primary) hypertension: Secondary | ICD-10-CM | POA: Diagnosis not present

## 2019-07-15 DIAGNOSIS — D509 Iron deficiency anemia, unspecified: Secondary | ICD-10-CM

## 2019-07-15 MED ORDER — AMLODIPINE BESYLATE 10 MG PO TABS: ORAL_TABLET | ORAL | 11 refills | Status: DC

## 2019-07-15 MED ORDER — SPIRONOLACTONE 25 MG PO TABS: 25.0000 mg | ORAL_TABLET | Freq: Every day | ORAL | 11 refills | Status: DC

## 2019-07-15 NOTE — Progress Notes (Signed)
Virtual Visit via Telephone Note  I connected with Diamond Le on 07/15/19 at  9:00 AM EDT by telephone and verified that I am speaking with the correct person using two identifiers.  Location: Patient: home Provider: office   I discussed the limitations, risks, security and privacy concerns of performing an evaluation and management service by telephone and the availability of in person appointments. I also discussed with the patient that there may be a patient responsible charge related to this service. The patient expressed understanding and agreed to proceed. This visit type is conducted due to national recommendations for restrictions regarding the COVID -19 Pandemic. Due to the patient's age and / or co morbidities, this format is felt to be most appropriate at this time without adequate follow up. The patient has no access to video technology/ had technical difficulties with video, requiring transitioning to audio format  only ( telephone ). All issues noted this document were discussed and addressed,no physical exam can be performed in this format.    History of Present Illness: F/u chronic problems States generally keeping well, concerned about lack of weight loss and the fact that she is not following through on lifestyle changes to facilitate this, wants twork on this  Denies recent fever or chills. Denies sinus pressure, nasal congestion, ear pain or sore throat. Denies chest congestion, productive cough or wheezing. Denies chest pains, palpitations and leg swelling Denies abdominal pain, nausea, vomiting,diarrhea or constipation.   Denies dysuria, frequency, hesitancy or incontinence. Denies joint pain, swelling and limitation in mobility. Denies headaches, seizures, numbness, or tingling. Denies depression, anxiety or insomnia. Denies skin break down or rash.       Observations/Objective: BP 130/78   Pulse 72   Ht 5\' 5"  (1.651 m)   Wt 229 lb (103.9 kg)   LMP  07/03/2019 (Approximate)   BMI 38.11 kg/m  pt reported weight and BP Good communication with no confusion and intact memory. Alert and oriented x 3 No signs of respiratory distress during speech    Assessment and Plan: Obesity, morbid ([place])  Patient re-educated about  the importance of commitment to a  minimum of 150 minutes of exercise per week as able.  The importance of healthy food choices with portion control discussed, as well as eating regularly and within a 12 hour window most days. The need to choose "clean , green" food 50 to 75% of the time is discussed, as well as to make water the primary drink and set a goal of 64 ounces water daily.    Weight /BMI 07/15/2019 01/14/2019 12/10/2018  WEIGHT 229 lb 229 lb 229 lb 8 oz  HEIGHT 5\' 5"  5\' 5"  5\' 5"   BMI 38.11 kg/m2 38.11 kg/m2 38.19 kg/m2      Essential hypertension Controlled, no change in medication DASH diet and commitment to daily physical activity for a minimum of 30 minutes discussed and encouraged, as a part of hypertension management. The importance of attaining a healthy weight is also discussed.  BP/Weight 07/15/2019 01/14/2019 12/10/2018 06/03/2017 05/09/2017 12/14/2016 2/58/5277  Systolic BP 824 235 361 443 154 008 676  Diastolic BP 78 83 75 82 195 68 69  Wt. (Lbs) 229 229 229.5 - 225 223 223  BMI 38.11 38.11 38.19 - 37.44 37.11 37.11       Obesity (BMI 30-39.9)  Patient re-educated about  the importance of commitment to a  minimum of 150 minutes of exercise per week as able.  The importance of healthy food  choices with portion control discussed, as well as eating regularly and within a 12 hour window most days. The need to choose "clean , green" food 50 to 75% of the time is discussed, as well as to make water the primary drink and set a goal of 64 ounces water daily.    Weight /BMI 07/15/2019 01/14/2019 12/10/2018  WEIGHT 229 lb 229 lb 229 lb 8 oz  HEIGHT 5\' 5"  5\' 5"  5\' 5"   BMI 38.11 kg/m2 38.11 kg/m2 38.19  kg/m2   2 to 3 pound per month weight loss goal set 1500 cl diet sheet provided   Vitamin D deficiency Updated lab needed at/ before next visit. Needs to supplement  IDA (iron deficiency anemia) Daily supplement recommended Updated lab needed at/ before next visit.     Follow Up Instructions:    I discussed the assessment and treatment plan with the patient. The patient was provided an opportunity to ask questions and all were answered. The patient agreed with the plan and demonstrated an understanding of the instructions.   The patient was advised to call back or seek an in-person evaluation if the symptoms worsen or if the condition fails to improve as anticipated.  I provided 22 minutes of non-face-to-face time during this encounter.   Tula Nakayama, MD

## 2019-07-15 NOTE — Assessment & Plan Note (Signed)
  Patient re-educated about  the importance of commitment to a  minimum of 150 minutes of exercise per week as able.  The importance of healthy food choices with portion control discussed, as well as eating regularly and within a 12 hour window most days. The need to choose "clean , green" food 50 to 75% of the time is discussed, as well as to make water the primary drink and set a goal of 64 ounces water daily.    Weight /BMI 07/15/2019 01/14/2019 12/10/2018  WEIGHT 229 lb 229 lb 229 lb 8 oz  HEIGHT 5\' 5"  5\' 5"  5\' 5"   BMI 38.11 kg/m2 38.11 kg/m2 38.19 kg/m2

## 2019-07-15 NOTE — Patient Instructions (Signed)
F/IU with MD mid January for shingrix # 1 also, call if you need me before  It is important that you exercise regularly at least 30 minutes 5 times a week. If you develop chest pain, have severe difficulty breathing, or feel very tired, stop exercising immediately and seek medical attention   Think about what you will eat, plan ahead. Choose " clean, green, fresh or frozen" over canned, processed or packaged foods which are more sugary, salty and fatty. 70 to 75% of food eaten should be vegetables and fruit. Three meals at set times with snacks allowed between meals, but they must be fruit or vegetables. Aim to eat over a 12 hour period , example 7 am to 7 pm, and STOP after  your last meal of the day. Drink water,generally about 64 ounces per day, no other drink is as healthy. Fruit juice is best enjoyed in a healthy way, by EATING the fruit.  1500 cal diet sheet is sent  Start oTC iron 325 mg one daily, and ensure you take once daily vit D 3 , 2000 IU  Fasting chem 7 and EGFR, CBC, iron, ferritin and vit D ( [facility]) 5 days before next appointment

## 2019-07-18 ENCOUNTER — Encounter: Payer: Self-pay | Admitting: Family Medicine

## 2019-07-18 DIAGNOSIS — E669 Obesity, unspecified: Secondary | ICD-10-CM | POA: Insufficient documentation

## 2019-07-18 DIAGNOSIS — D509 Iron deficiency anemia, unspecified: Secondary | ICD-10-CM | POA: Insufficient documentation

## 2019-07-18 NOTE — Assessment & Plan Note (Signed)
Updated lab needed at/ before next visit. Needs to supplement

## 2019-07-18 NOTE — Assessment & Plan Note (Signed)
  Patient re-educated about  the importance of commitment to a  minimum of 150 minutes of exercise per week as able.  The importance of healthy food choices with portion control discussed, as well as eating regularly and within a 12 hour window most days. The need to choose "clean , green" food 50 to 75% of the time is discussed, as well as to make water the primary drink and set a goal of 64 ounces water daily.    Weight /BMI 07/15/2019 01/14/2019 12/10/2018  WEIGHT 229 lb 229 lb 229 lb 8 oz  HEIGHT 5\' 5"  5\' 5"  5\' 5"   BMI 38.11 kg/m2 38.11 kg/m2 38.19 kg/m2   2 to 3 pound per month weight loss goal set 1500 cl diet sheet provided

## 2019-07-18 NOTE — Assessment & Plan Note (Signed)
Controlled, no change in medication DASH diet and commitment to daily physical activity for a minimum of 30 minutes discussed and encouraged, as a part of hypertension management. The importance of attaining a healthy weight is also discussed.  BP/Weight 07/15/2019 01/14/2019 12/10/2018 06/03/2017 05/09/2017 12/14/2016 [id number]  Systolic BP [id number] Q000111Q [id number] Q000111Q 123456 123456 Q000111Q  Diastolic BP 78 83 75 82 [id number] 68 69  Wt. (Lbs) 229 229 229.5 - 225 223 223  BMI 38.11 38.11 38.19 - 37.44 37.11 37.11

## 2019-07-18 NOTE — Assessment & Plan Note (Signed)
Daily supplement recommended °Updated lab needed at/ before next visit. ° °

## 2019-07-29 ENCOUNTER — Encounter (INDEPENDENT_AMBULATORY_CARE_PROVIDER_SITE_OTHER): Payer: Self-pay | Admitting: *Deleted

## 2019-08-06 ENCOUNTER — Other Ambulatory Visit: Payer: Self-pay

## 2019-08-06 ENCOUNTER — Telehealth (INDEPENDENT_AMBULATORY_CARE_PROVIDER_SITE_OTHER): Payer: Self-pay | Admitting: *Deleted

## 2019-08-06 ENCOUNTER — Ambulatory Visit (INDEPENDENT_AMBULATORY_CARE_PROVIDER_SITE_OTHER): Payer: Self-pay

## 2019-08-06 NOTE — Telephone Encounter (Signed)
Referring MD: jennifer griffin (GYN)           PCP: simpson   Procedure: tcs  Reason/Indication:  screening  Has patient had this procedure before?  no             If so, when, by whom and where?    Is there a family history of colon cancer?  no             Who?  What age when diagnosed?    Is patient diabetic?   no                                                  Does patient have prosthetic heart valve or mechanical valve?  no  Do you have a pacemaker?  no  Has patient ever had endocarditis? no  Has patient had joint replacement within last 12 months?  no  Is patient constipated or do they take laxatives? no  Does patient have a history of alcohol/drug use?  no  Is patient on blood thinner such as Coumadin, Plavix and/or Aspirin? no  Medications: amlodipine 10 mg daily, spironolactone 25 mg daily  Allergies: nkda  Medication Adjustment per Dr Lindi Adie, NP:   Procedure date & time: 09/02/19 at 730

## 2019-08-10 NOTE — Telephone Encounter (Signed)
Okay to schedule colonoscopy with conscious sedation 

## 2019-08-31 ENCOUNTER — Other Ambulatory Visit (HOSPITAL_COMMUNITY)
Admission: RE | Admit: 2019-08-31 | Discharge: 2019-08-31 | Disposition: A | Discharge status: Home / Self Care | Payer: Commercial Managed Care - PPO | Source: Ambulatory Visit | Attending: Internal Medicine | Admitting: Internal Medicine

## 2019-08-31 ENCOUNTER — Other Ambulatory Visit: Payer: Self-pay

## 2019-09-29 ENCOUNTER — Other Ambulatory Visit (INDEPENDENT_AMBULATORY_CARE_PROVIDER_SITE_OTHER): Payer: Self-pay | Admitting: *Deleted

## 2019-09-29 ENCOUNTER — Encounter (INDEPENDENT_AMBULATORY_CARE_PROVIDER_SITE_OTHER): Payer: Self-pay | Admitting: *Deleted

## 2019-10-26 ENCOUNTER — Ambulatory Visit (INDEPENDENT_AMBULATORY_CARE_PROVIDER_SITE_OTHER): Payer: Self-pay

## 2019-10-26 ENCOUNTER — Other Ambulatory Visit: Payer: Self-pay

## 2019-10-26 ENCOUNTER — Telehealth (INDEPENDENT_AMBULATORY_CARE_PROVIDER_SITE_OTHER): Payer: Self-pay | Admitting: *Deleted

## 2019-10-26 NOTE — Telephone Encounter (Signed)
Referring MD: jennifer griffin (GYN)PCP:simpson   Procedure:tcs  Reason/Indication:screening  Has patient had this procedure before?no If so, when, by whom and where?   Is there a family history of colon cancer?no Who? What age when diagnosed?   Is patient diabetic?no  Does patient have prosthetic heart valveor mechanical valve?no  Do you have a pacemaker?no  Has patient ever had endocarditis?no  Has patient had joint replacement within last 12 months?no  Is patient constipated or do they take laxatives?no  Does patient have a history of alcohol/drug use?no  Is patient onblood thinner such as Coumadin, Plavix and/or Aspirin? no  Medications:amlodipine 10 mg daily, spironolactone 25 mg daily  Allergies:nkda  Medication Adjustment per Dr Laural Golden:   Procedure date & time:11/25/19 at 57

## 2019-11-02 NOTE — Telephone Encounter (Signed)
Colonoscopy with conscious sedation 

## 2019-11-18 ENCOUNTER — Other Ambulatory Visit: Payer: Self-pay

## 2019-11-23 ENCOUNTER — Other Ambulatory Visit: Payer: Self-pay

## 2019-11-23 ENCOUNTER — Other Ambulatory Visit (HOSPITAL_COMMUNITY)
Admission: RE | Admit: 2019-11-23 | Discharge: 2019-11-23 | Disposition: A | Discharge status: Home / Self Care | Payer: Commercial Managed Care - PPO | Source: Ambulatory Visit | Attending: Internal Medicine | Admitting: Internal Medicine

## 2019-11-25 ENCOUNTER — Ambulatory Visit (HOSPITAL_COMMUNITY)
Admission: RE | Admit: 2019-11-25 | Payer: Commercial Managed Care - PPO | Source: Home / Self Care | Admitting: Internal Medicine

## 2019-11-25 ENCOUNTER — Encounter (HOSPITAL_COMMUNITY): Admission: RE | Payer: Self-pay | Source: Home / Self Care

## 2019-11-25 DIAGNOSIS — Z1211 Encounter for screening for malignant neoplasm of colon: Secondary | ICD-10-CM

## 2019-11-25 SURGERY — COLONOSCOPY
Anesthesia: Moderate Sedation

## 2019-12-14 ENCOUNTER — Ambulatory Visit: Payer: Commercial Managed Care - PPO | Admitting: Family Medicine

## 2019-12-14 ENCOUNTER — Other Ambulatory Visit: Payer: Self-pay

## 2019-12-22 ENCOUNTER — Ambulatory Visit (INDEPENDENT_AMBULATORY_CARE_PROVIDER_SITE_OTHER): Payer: Commercial Managed Care - PPO | Admitting: Family Medicine

## 2019-12-22 ENCOUNTER — Encounter: Payer: Self-pay | Admitting: Family Medicine

## 2019-12-22 ENCOUNTER — Other Ambulatory Visit: Payer: Self-pay

## 2019-12-22 VITALS — BP 130/78 | HR 72 | Resp 15 | Ht 65.0 in | Wt 229.0 lb

## 2019-12-22 DIAGNOSIS — E559 Vitamin D deficiency, unspecified: Secondary | ICD-10-CM | POA: Diagnosis not present

## 2019-12-22 DIAGNOSIS — E669 Obesity, unspecified: Secondary | ICD-10-CM

## 2019-12-22 DIAGNOSIS — Z13220 Encounter for screening for lipoid disorders: Secondary | ICD-10-CM

## 2019-12-22 DIAGNOSIS — I10 Essential (primary) hypertension: Secondary | ICD-10-CM | POA: Diagnosis not present

## 2019-12-22 MED ORDER — MINOXIDIL 2.5 MG PO TABS: ORAL_TABLET | ORAL | 2 refills | Status: DC

## 2019-12-22 MED ORDER — FLUCONAZOLE 150 MG PO TABS: 150.0000 mg | ORAL_TABLET | Freq: Once | ORAL | 5 refills | Status: AC

## 2019-12-22 NOTE — Progress Notes (Signed)
Virtual Visit via Telephone Note  I connected with Diamond Le on 12/22/19 at  3:40 PM EST by telephone and verified that I am speaking with the correct person using two identifiers.  Location: Patient: home Provider: office   I discussed the limitations, risks, security and privacy concerns of performing an evaluation and management service by telephone and the availability of in person appointments. I also discussed with the patient that there may be a patient responsible charge related to this service. The patient expressed understanding and agreed to proceed.   History of Present Illness: F/U chronic problems, medication review, and refill medication when necessary. Review most recent labs and order labs which are due Review preventive health and update with necessary referrals or immunizations as indicated C/o excessive hair loss , wants change in BP medication to see if this will improve Denies recent fever or chills. Denies sinus pressure, nasal congestion, ear pain or sore throat. Denies chest congestion, productive cough or wheezing. Denies chest pains, palpitations and leg swelling Denies abdominal pain, nausea, vomiting,diarrhea or constipation.   Denies dysuria, frequency, hesitancy or incontinence. Denies joint pain, swelling and limitation in mobility. Denies headaches, seizures, numbness, or tingling. Denies depression, anxiety or insomnia. Denies skin break down or rash. No commitment to lifestyle change as she  Needs and desires , but will be working on this      Observations/Objective: BP 130/78   Pulse 72   Resp 15   Ht 5\' 5"  (1.651 m)   Wt 229 lb (103.9 kg)   BMI 38.11 kg/m  Good communication with no confusion and intact memory. Alert and oriented x 3 No signs of respiratory distress during speech    Assessment and Plan:  Essential hypertension Controlled , however may be havoing hair loss because of amlodipine, changing to minoxidil and re  eval DASH diet and commitment to daily physical activity for a minimum of 30 minutes discussed and encouraged, as a part of hypertension management. The importance of attaining a healthy weight is also discussed.  BP/Weight 12/22/2019 07/15/2019 01/14/2019 12/10/2018 06/03/2017 05/09/2017 [id number]  Systolic BP [id number] Q000111Q [id number] Q000111Q 123456 123456  Diastolic BP 78 78 83 75 82 100 68  Wt. (Lbs) 229 229 229 229.5 - 225 223  BMI 38.11 38.11 38.11 38.19 - 37.44 37.11       Obesity (BMI 30-39.9)  Patient re-educated about  the importance of commitment to a  minimum of 150 minutes of exercise per week as able.  The importance of healthy food choices with portion control discussed, as well as eating regularly and within a 12 hour window most days. The need to choose "clean , green" food 50 to 75% of the time is discussed, as well as to make water the primary drink and set a goal of 64 ounces water daily.    Weight /BMI 12/22/2019 07/15/2019 01/14/2019  WEIGHT 229 lb 229 lb 229 lb  HEIGHT 5\' 5"  5\' 5"  5\' 5"   BMI 38.11 kg/m2 38.11 kg/m2 38.11 kg/m2      Vitamin D deficiency Reminded of the need to take medication daily Updated lab needed at/ before next visit.    Follow Up Instructions:    I discussed the assessment and treatment plan with the patient. The patient was provided an opportunity to ask questions and all were answered. The patient agreed with the plan and demonstrated an understanding of the instructions.   The patient was advised to call back or seek an in-person  evaluation if the symptoms worsen or if the condition fails to improve as anticipated.  I provided 15 minutes of non-face-to-face time during this encounter.   Tula Nakayama, MD

## 2019-12-22 NOTE — Patient Instructions (Addendum)
F/U in office with MD last week in March, call if you need me sooner  New for blood pressure is minoxidil 2.5 mg TWO tablets daily, stop amlodipine, we will see if this stops the hair loss  Please commit to taking your Vitamin D every day  Please try to schedule your colonoscopy, you NEED this  Please get CBC, fasting lipid, cmp and EGFr, HBa1C, TSH and vit D , 1 week before next appointment  It is important that you exercise regularly at least 30 minutes 5 times a week. If you develop chest pain, have severe difficulty breathing, or feel very tired, stop exercising immediately and seek medical attention   Think about what you will eat, plan ahead. Choose " clean, green, fresh or frozen" over canned, processed or packaged foods which are more sugary, salty and fatty. 70 to 75% of food eaten should be vegetables and fruit. Three meals at set times with snacks allowed between meals, but they must be fruit or vegetables. Aim to eat over a 12 hour period , example 7 am to 7 pm, and STOP after  your last meal of the day. Drink water,generally about 64 ounces per day, no other drink is as healthy. Fruit juice is best enjoyed in a healthy way, by EATING the fruit. Thanks for choosing [facility], we consider it a privelige to serve you.

## 2019-12-24 ENCOUNTER — Encounter: Payer: Self-pay | Admitting: Family Medicine

## 2019-12-24 NOTE — Assessment & Plan Note (Signed)
  Patient re-educated about  the importance of commitment to a  minimum of 150 minutes of exercise per week as able.  The importance of healthy food choices with portion control discussed, as well as eating regularly and within a 12 hour window most days. The need to choose "clean , green" food 50 to 75% of the time is discussed, as well as to make water the primary drink and set a goal of 64 ounces water daily.    Weight /BMI 12/22/2019 07/15/2019 01/14/2019  WEIGHT 229 lb 229 lb 229 lb  HEIGHT 5\' 5"  5\' 5"  5\' 5"   BMI 38.11 kg/m2 38.11 kg/m2 38.11 kg/m2

## 2019-12-24 NOTE — Assessment & Plan Note (Signed)
Controlled , however may be havoing hair loss because of amlodipine, changing to minoxidil and re eval DASH diet and commitment to daily physical activity for a minimum of 30 minutes discussed and encouraged, as a part of hypertension management. The importance of attaining a healthy weight is also discussed.  BP/Weight 12/22/2019 07/15/2019 01/14/2019 12/10/2018 06/03/2017 05/09/2017 [id number]  Systolic BP [id number] Q000111Q [id number] Q000111Q 123456 123456  Diastolic BP 78 78 83 75 82 100 68  Wt. (Lbs) 229 229 229 229.5 - 225 223  BMI 38.11 38.11 38.11 38.19 - 37.44 37.11

## 2019-12-24 NOTE — Assessment & Plan Note (Signed)
Reminded of the need to take medication daily Updated lab needed at/ before next visit.

## 2020-01-18 ENCOUNTER — Telehealth: Payer: Self-pay

## 2020-01-18 NOTE — Telephone Encounter (Signed)
Had changed from norvasc to minoxidil once daily. Works for the first half of the day but after that her blood pressure will start going back up to around 158/96- 185/122. But when she takes the med it will come back down to around 105/78. Wants to know if she needs to increase to bid?

## 2020-01-18 NOTE — Telephone Encounter (Signed)
I recommend taking the medication in the evening instead of the morning, and please move her in office follow up visit with me to a sooner appt

## 2020-01-18 NOTE — Telephone Encounter (Signed)
She does take two ONCE daily (to make 5mg ) but it wears off the 2nd half of the day. Was asking if she should take 2 pills twice daily.

## 2020-01-18 NOTE — Telephone Encounter (Signed)
Her original prescription says take tWO once daily, needs to take as directed please!

## 2020-01-18 NOTE — Telephone Encounter (Signed)
Pt LVM that her BP medication is not working --please call her

## 2020-01-19 ENCOUNTER — Telehealth: Payer: Self-pay

## 2020-01-19 NOTE — Telephone Encounter (Signed)
Patient aware and appt moved to this week

## 2020-01-21 ENCOUNTER — Other Ambulatory Visit: Payer: Self-pay

## 2020-01-21 ENCOUNTER — Ambulatory Visit (INDEPENDENT_AMBULATORY_CARE_PROVIDER_SITE_OTHER): Payer: Self-pay | Admitting: Family Medicine

## 2020-01-21 ENCOUNTER — Encounter: Payer: Self-pay | Admitting: Family Medicine

## 2020-01-21 VITALS — BP 150/90 | HR 95 | Temp 98.2°F | Resp 15 | Ht 66.0 in | Wt 243.0 lb

## 2020-01-21 DIAGNOSIS — I10 Essential (primary) hypertension: Secondary | ICD-10-CM

## 2020-01-21 DIAGNOSIS — R002 Palpitations: Secondary | ICD-10-CM

## 2020-01-21 DIAGNOSIS — E559 Vitamin D deficiency, unspecified: Secondary | ICD-10-CM

## 2020-01-21 DIAGNOSIS — E669 Obesity, unspecified: Secondary | ICD-10-CM

## 2020-01-21 MED ORDER — SPIRONOLACTONE 50 MG PO TABS: 50.0000 mg | ORAL_TABLET | Freq: Every day | ORAL | 4 refills | Status: DC

## 2020-01-21 NOTE — Assessment & Plan Note (Signed)
  Patient re-educated about  the importance of commitment to a  minimum of 150 minutes of exercise per week as able.  The importance of healthy food choices with portion control discussed, as well as eating regularly and within a 12 hour window most days. The need to choose "clean , green" food 50 to 75% of the time is discussed, as well as to make water the primary drink and set a goal of 64 ounces water daily.    Weight /BMI 01/21/2020 12/22/2019 07/15/2019  WEIGHT 243 lb 229 lb 229 lb  HEIGHT 5\' 6"  5\' 5"  5\' 5"   BMI 39.22 kg/m2 38.11 kg/m2 38.11 kg/m2

## 2020-01-21 NOTE — Assessment & Plan Note (Signed)
Updated lab needed at/ before next visit.   

## 2020-01-21 NOTE — Assessment & Plan Note (Addendum)
Uncontrolled , inc spironolactone, continue minoxidil as before DASH diet and commitment to daily physical activity for a minimum of 30 minutes discussed and encouraged, as a part of hypertension management. The importance of attaining a healthy weight is also discussed.  BP/Weight 01/21/2020 12/22/2019 07/15/2019 01/14/2019 12/10/2018 06/03/2017 123456  Systolic BP Q000111Q [id number] AB-123456789 Q000111Q AB-123456789 Q000111Q 123456  Diastolic BP 90 78 78 83 75 82 100  Wt. (Lbs) 243 229 229 229 229.5 - 225  BMI 39.22 38.11 38.11 38.11 38.19 - 37.44

## 2020-01-21 NOTE — Patient Instructions (Signed)
F/U in office in 2 months, call if you need me before  You are referred to Cardiology  [facility] today because of new palpitations  Labs fasting as soon as possible  Increase dose of spironolactone to 50 mg daily, continue minoxidil as before   It is important that you exercise regularly at least 30 minutes 5 times a week. If you develop chest pain, have severe difficulty breathing, or feel very tired, stop exercising immediately and seek medical attention   Think about what you will eat, plan ahead. Choose " clean, green, fresh or frozen" over canned, processed or packaged foods which are more sugary, salty and fatty. 70 to 75% of food eaten should be vegetables and fruit. Three meals at set times with snacks allowed between meals, but they must be fruit or vegetables. Aim to eat over a 12 hour period , example 7 am to 7 pm, and STOP after  your last meal of the day. Drink water,generally about 64 ounces per day, no other drink is as healthy. Fruit juice is best enjoyed in a healthy way, by EATING the fruit. Thanks for choosing [facility], we consider it a privelige to serve you.

## 2020-01-21 NOTE — Progress Notes (Signed)
Diamond Le     MRN: [id number]      DOB: 1966/08/13   HPI Ms. Heiden is here for follow up and re-evaluation of chronic medical conditions, medication management and review of any available recent lab and radiology data.  Preventive health is updated, specifically  Cancer screening and Immunization.   Questions or concerns regarding consultations or procedures which the PT has had in the interim are  addressed. The PT denies any adverse reactions to current medications since the last visit.  C/o uncontrolled blood pressure, new palpitations and leg swelling noted most since changing bP medication from amlodipine to minoxidil.Uses 2 pillows and getst ired more easily, has ahd hypertension for approximately 25 years  \Concerned and startled re her 20 pound weight gain and is ready to address this ROS Denies recent fever or chills. Denies sinus pressure, nasal congestion, ear pain or sore throat. Denies chest congestion, productive cough or wheezing.  Denies abdominal pain, nausea, vomiting,diarrhea or constipation.   Denies dysuria, frequency, hesitancy or incontinence. Denies joint pain, swelling and limitation in mobility. Denies headaches, seizures, numbness, or tingling. Denies depression, anxiety or insomnia. Denies skin break down or rash.   PE  BP (!) 150/90 Comment: left arm  Pulse 95   Temp 98.2 F (36.8 C) (Temporal)   Resp 15   Ht 5\' 6"  (1.676 m)   Wt 243 lb (110.2 kg)   SpO2 97%   BMI 39.22 kg/m    Patient alert and oriented and in no cardiopulmonary distress.  HEENT: No facial asymmetry, EOMI,     Neck supple .  Chest: Clear to auscultation bilaterally.  CVS: S1, S2 no murmurs, no S3.Regular rate. EKG: NSR, no ischemia, no LVH,rate 85 ABD: Soft non tender.   Ext: No edema  MS: Adequate ROM spine, shoulders, hips and knees.  Skin: Intact, no ulcerations or rash noted.  Psych: Good eye contact, normal affect. Memory intact not anxious or  depressed appearing.  CNS: CN 2-12 intact, power,  normal throughout.no focal deficits noted.   Assessment & Plan  Essential hypertension Uncontrolled , inc spironolactone, continue minoxidil as before DASH diet and commitment to daily physical activity for a minimum of 30 minutes discussed and encouraged, as a part of hypertension management. The importance of attaining a healthy weight is also discussed.  BP/Weight 01/21/2020 12/22/2019 07/15/2019 01/14/2019 12/10/2018 06/03/2017 123456  Systolic BP Q000111Q [id number] AB-123456789 Q000111Q AB-123456789 Q000111Q 123456  Diastolic BP 90 78 78 83 75 82 100  Wt. (Lbs) 243 229 229 229 229.5 - 225  BMI 39.22 38.11 38.11 38.11 38.19 - 37.44       Palpitations 5 week h/o intermittent palpitations, associated wit increased leg swelling. Has an over 25 year h/o hypertension, EKG: NSR, no ischemia, no lVH Refer to cardiology  Obesity (BMI 30-39.9)  Patient re-educated about  the importance of commitment to a  minimum of 150 minutes of exercise per week as able.  The importance of healthy food choices with portion control discussed, as well as eating regularly and within a 12 hour window most days. The need to choose "clean , green" food 50 to 75% of the time is discussed, as well as to make water the primary drink and set a goal of 64 ounces water daily.    Weight /BMI 01/21/2020 12/22/2019 07/15/2019  WEIGHT 243 lb 229 lb 229 lb  HEIGHT 5\' 6"  5\' 5"  5\' 5"   BMI 39.22 kg/m2 38.11 kg/m2 38.11 kg/m2  Vitamin D deficiency Updated lab needed at/ before next visit.

## 2020-01-21 NOTE — Assessment & Plan Note (Signed)
5 week h/o intermittent palpitations, associated wit increased leg swelling. Has an over 25 year h/o hypertension, EKG: NSR, no ischemia, no lVH Refer to cardiology

## 2020-01-26 ENCOUNTER — Encounter: Payer: Self-pay | Admitting: Family Medicine

## 2020-01-28 LAB — COMPLETE METABOLIC PANEL WITH GFR
AG Ratio: 1.6 (calc) (ref 1.0–2.5)
ALT: 19 U/L (ref 6–29)
AST: 20 U/L (ref 10–35)
Albumin: 4.1 g/dL (ref 3.6–5.1)
Alkaline phosphatase (APISO): 77 U/L (ref 37–153)
BUN: 11 mg/dL (ref 7–25)
CO2: 26 mmol/L (ref 20–32)
Calcium: 8.9 mg/dL (ref 8.6–10.4)
Chloride: 105 mmol/L (ref 98–110)
Creat: 0.92 mg/dL (ref 0.50–1.05)
GFR, Est African American: 84 mL/min/{1.73_m2} (ref >=60)
GFR, Est Non African American: 72 mL/min/{1.73_m2} (ref >=60)
Globulin: 2.5 g/dL (calc) (ref 1.9–3.7)
Glucose, Bld: 89 mg/dL (ref 65–99)
Potassium: 4.2 mmol/L (ref 3.5–5.3)
Sodium: 137 mmol/L (ref 135–146)
Total Bilirubin: 0.5 mg/dL (ref 0.2–1.2)
Total Protein: 6.6 g/dL (ref 6.1–8.1)

## 2020-01-28 LAB — TEST AUTHORIZATION

## 2020-01-28 LAB — TSH: TSH: 2.56 mIU/L

## 2020-01-28 LAB — CBC
HCT: 34.5 % — ABNORMAL LOW (ref 35.0–45.0)
Hemoglobin: 11.4 g/dL — ABNORMAL LOW (ref 11.7–15.5)
MCH: 28.1 pg (ref 27.0–33.0)
MCHC: 33 g/dL (ref 32.0–36.0)
MCV: 85 fL (ref 80.0–100.0)
MPV: 10.3 fL (ref 7.5–12.5)
Platelets: 283 10*3/uL (ref 140–400)
RBC: 4.06 10*6/uL (ref 3.80–5.10)
RDW: 12.1 % (ref 11.0–15.0)
WBC: 4.5 10*3/uL (ref 3.8–10.8)

## 2020-01-28 LAB — HEMOGLOBIN A1C
Hgb A1c MFr Bld: 4.7 % of total Hgb (ref <5.7)
Mean Plasma Glucose: 88 (calc)
eAG (mmol/L): 4.9 (calc)

## 2020-01-28 LAB — FERRITIN: Ferritin: 14 ng/mL — ABNORMAL LOW (ref 16–232)

## 2020-01-28 LAB — LIPID PANEL
Cholesterol: 174 mg/dL (ref <200)
HDL: 49 mg/dL — ABNORMAL LOW (ref >=50)
LDL Cholesterol (Calc): 108 mg/dL (calc) — ABNORMAL HIGH
Non-HDL Cholesterol (Calc): 125 mg/dL (calc) (ref <130)
Total CHOL/HDL Ratio: 3.6 (calc) (ref <5.0)
Triglycerides: 84 mg/dL (ref <150)

## 2020-01-28 LAB — IRON: Iron: 87 ug/dL (ref 45–160)

## 2020-02-15 ENCOUNTER — Ambulatory Visit: Payer: Commercial Managed Care - PPO | Admitting: Family Medicine

## 2020-02-20 ENCOUNTER — Other Ambulatory Visit: Payer: Self-pay

## 2020-02-20 ENCOUNTER — Emergency Department (HOSPITAL_COMMUNITY)
Admission: EM | Admit: 2020-02-20 | Discharge: 2020-02-20 | Disposition: A | Discharge status: Home / Self Care | Payer: Self-pay | Attending: Emergency Medicine | Admitting: Emergency Medicine

## 2020-02-20 ENCOUNTER — Encounter (HOSPITAL_COMMUNITY): Payer: Self-pay | Admitting: Emergency Medicine

## 2020-02-20 DIAGNOSIS — I10 Essential (primary) hypertension: Secondary | ICD-10-CM | POA: Insufficient documentation

## 2020-02-20 DIAGNOSIS — Z79899 Other long term (current) drug therapy: Secondary | ICD-10-CM | POA: Insufficient documentation

## 2020-02-20 LAB — COMPREHENSIVE METABOLIC PANEL
ALT: 21 U/L (ref 0–44)
AST: 24 U/L (ref 15–41)
Albumin: 4.1 g/dL (ref 3.5–5.0)
Alkaline Phosphatase: 73 U/L (ref 38–126)
Anion gap: 8 (ref 5–15)
BUN: 10 mg/dL (ref 6–20)
CO2: 27 mmol/L (ref 22–32)
Calcium: 9.1 mg/dL (ref 8.9–10.3)
Chloride: 102 mmol/L (ref 98–111)
Creatinine, Ser: 0.95 mg/dL (ref 0.44–1.00)
GFR calc Af Amer: >60 mL/min (ref >60)
GFR calc non Af Amer: >60 mL/min (ref >60)
Glucose, Bld: 135 mg/dL — ABNORMAL HIGH (ref 70–99)
Potassium: 3.7 mmol/L (ref 3.5–5.1)
Sodium: 137 mmol/L (ref 135–145)
Total Bilirubin: 0.5 mg/dL (ref 0.3–1.2)
Total Protein: 7.4 g/dL (ref 6.5–8.1)

## 2020-02-20 LAB — CBC WITH DIFFERENTIAL/PLATELET
Abs Immature Granulocytes: 0.01 10*3/uL (ref 0.00–0.07)
Basophils Absolute: 0 10*3/uL (ref 0.0–0.1)
Basophils Relative: 0 %
Eosinophils Absolute: 0.1 10*3/uL (ref 0.0–0.5)
Eosinophils Relative: 2 %
HCT: 36.9 % (ref 36.0–46.0)
Hemoglobin: 12.1 g/dL (ref 12.0–15.0)
Immature Granulocytes: 0 %
Lymphocytes Relative: 19 %
Lymphs Abs: 1.2 10*3/uL (ref 0.7–4.0)
MCH: 28.1 pg (ref 26.0–34.0)
MCHC: 32.8 g/dL (ref 30.0–36.0)
MCV: 85.6 fL (ref 80.0–100.0)
Monocytes Absolute: 0.4 10*3/uL (ref 0.1–1.0)
Monocytes Relative: 6 %
Neutro Abs: 4.4 10*3/uL (ref 1.7–7.7)
Neutrophils Relative %: 73 %
Platelets: 292 10*3/uL (ref 150–400)
RBC: 4.31 MIL/uL (ref 3.87–5.11)
RDW: 12.5 % (ref 11.5–15.5)
WBC: 6.1 10*3/uL (ref 4.0–10.5)
nRBC: 0 % (ref 0.0–0.2)

## 2020-02-20 NOTE — Discharge Instructions (Addendum)
Follow up with your physician as scheduled

## 2020-02-20 NOTE — ED Triage Notes (Signed)
Patient c/o hypertension. Per patient soon checked her blood pressure prior to her coming to ED, 165/120. Patient denies any headache or dizziness. Per patient numbness in left upper lip that started yesterday 2:30pm. Patient reports changes in blood pressure medication in ? January. Patient has intermittent swelling that has increased recently. Patient also reports that she has intermittent irregular heartbeat and heaviness in legs bilateral. The heaviness in legs started after getting second Covid vaccine 12/2019.

## 2020-02-20 NOTE — ED Provider Notes (Signed)
[facility] EMERGENCY DEPARTMENT Provider Note   CSN: [id number] Arrival date & time: 02/20/20  1545     History Chief Complaint  Patient presents with  . Hypertension    Diamond Le is a 54 y.o. female.  The history is provided by the patient. No language interpreter was used.  Hypertension This is a new problem. The current episode started 3 to 5 hours ago. The problem occurs constantly. The problem has not changed since onset.Pertinent negatives include no chest pain and no abdominal pain. Nothing aggravates the symptoms. Nothing relieves the symptoms. She has tried nothing for the symptoms. The treatment provided no relief.   Pt reports she has not felt well today since waking up this am.  Pt reports she checked her blood pressure at home and her blood pressure was high.  Pt reports her MD recently switched her blood pressure medication.  Pt states she changed back to the original.      Past Medical History:  Diagnosis Date  . Folliculitis [id number]  . Hypertension   . Obesity   . Palpitation   . Superficial fungus infection of skin 04/19/2015  . Vitamin D deficiency     Patient Active Problem List   Diagnosis Date Noted  . Obesity (BMI 30-39.9) 07/18/2019  . IDA (iron deficiency anemia) 07/18/2019  . Special screening for malignant neoplasms, colon 01/15/2019  . Vitamin D deficiency 03/21/2010  . Palpitations 03/21/2010  . Essential hypertension 04/11/2009    Past Surgical History:  Procedure Laterality Date  . BREAST CYST EXCISION Right 12/14/2016  . MASS EXCISION Right 12/14/2016   Procedure: EXCISION CYST RIGHT AXILLA;  Surgeon: Aviva Signs, MD;  Location: [facility];  Service: General;  Laterality: Right;     OB History    Gravida  2   Para  2   Term  2   Preterm      AB      Living  2     SAB      TAB      Ectopic      Multiple      Live Births  2           Family History  Problem Relation Age of Onset  . Asthma Mother    most of her life   . Cancer Father        lung  . Cancer Paternal Aunt        cervical   . Hypertension Maternal Grandmother   . Ovarian cancer Other     Social History   Tobacco Use  . Smoking status: Never Smoker  . Smokeless tobacco: Never Used  Substance Use Topics  . Alcohol use: No  . Drug use: No    Home Medications Prior to Admission medications   Medication Sig Start Date End Date Taking? Authorizing Provider  amLODipine (NORVASC) 10 MG tablet Take 10 mg by mouth daily.   Yes [provider]  cholecalciferol (VITAMIN D3) 25 MCG (1000 UNIT) tablet Take 1,000 Units by mouth daily.   Yes [provider]  spironolactone (ALDACTONE) 50 MG tablet Take 1 tablet (50 mg total) by mouth daily. Dose increase effective 01/21/2020 01/21/20  Yes Fayrene Helper, MD  minoxidil (LONITEN) 2.5 MG tablet Take two tablets once daily every morning by mouth,  for blood pressure Patient not taking: Reported on 02/20/2020 12/22/19   Fayrene Helper, MD    Allergies    Patient has no known allergies.  Review of Systems   Review of Systems  Cardiovascular: Negative for chest pain.  Gastrointestinal: Negative for abdominal pain.  All other systems reviewed and are negative.   Physical Exam Updated Vital Signs BP 126/73 (BP Location: Right Arm)   Pulse 91   Temp 98.7 F (37.1 C) (Oral)   Resp 14   Ht 5\' 6"  (1.676 m)   Wt 110.2 kg   LMP 01/30/2020   SpO2 98%   BMI 39.22 kg/m   Physical Exam Vitals and nursing note reviewed.  Constitutional:      Appearance: She is well-developed.  HENT:     Head: Normocephalic and atraumatic.  Cardiovascular:     Rate and Rhythm: Normal rate and regular rhythm.  Pulmonary:     Effort: Pulmonary effort is normal.     Breath sounds: Normal breath sounds.  Abdominal:     General: There is no distension.  Musculoskeletal:        General: Normal range of motion.     Cervical back: Normal range of motion.  Skin:     General: Skin is warm.  Neurological:     General: No focal deficit present.     Mental Status: She is alert and oriented to person, place, and time.  Psychiatric:        Mood and Affect: Mood normal.     ED Results / Procedures / Treatments   Labs (all labs ordered are listed, but only abnormal results are displayed) Labs Reviewed  COMPREHENSIVE METABOLIC PANEL - Abnormal; Notable for the following components:      Result Value   Glucose, Bld 135 (*)    All other components within normal limits  CBC WITH DIFFERENTIAL/PLATELET    EKG EKG Interpretation  Date/Time:  Saturday February 20 2020 16:26:50 EDT Ventricular Rate:  98 PR Interval:    QRS Duration: 79 QT Interval:  359 QTC Calculation: 459 R Axis:   53 Text Interpretation: Sinus rhythm Probable left atrial enlargement Baseline wander in lead(s) II III aVR aVL aVF Confirmed by Virgel Manifold [phone]) on 02/20/2020 4:58:34 PM   Radiology No results found.  Procedures Procedures (including critical care time)  Medications Ordered in ED Medications - No data to display  ED Course  I have reviewed the triage vital signs and the nursing notes.  Pertinent labs & imaging results that were available during my care of the patient were reviewed by me and considered in my medical decision making (see chart for details).    MDM Rules/Calculators/A&P                      MDM: EKG normal,  Cmet and cbc normal.  Pt monitored.  Blood pressure remains normal.  Rechecked in both arms.  Pt given information of DASH diet and advised to follow up with her MD Final Clinical Impression(s) / ED Diagnoses Final diagnoses:  Hypertension, unspecified type    Rx / DC Orders ED Discharge Orders    None    An After Visit Summary was printed and given to the patient.    Sidney Ace 02/20/20 2103    Virgel Manifold, MD 02/21/20 [phone]

## 2020-02-24 ENCOUNTER — Encounter: Payer: Self-pay | Admitting: Cardiovascular Disease

## 2020-02-24 ENCOUNTER — Telehealth: Payer: Self-pay | Admitting: Cardiovascular Disease

## 2020-02-24 ENCOUNTER — Ambulatory Visit (INDEPENDENT_AMBULATORY_CARE_PROVIDER_SITE_OTHER): Payer: Self-pay | Admitting: Cardiovascular Disease

## 2020-02-24 ENCOUNTER — Other Ambulatory Visit: Payer: Self-pay

## 2020-02-24 VITALS — BP 152/100 | HR 79 | Temp 97.5°F | Ht 66.0 in | Wt 237.0 lb

## 2020-02-24 DIAGNOSIS — I10 Essential (primary) hypertension: Secondary | ICD-10-CM

## 2020-02-24 MED ORDER — LISINOPRIL 5 MG PO TABS: 5.0000 mg | ORAL_TABLET | Freq: Every day | ORAL | 6 refills | Status: DC

## 2020-02-24 NOTE — Patient Instructions (Addendum)
Medication Instructions:   Begin Lisinopril 5mg  daily  Continue all other medications.    Labwork:  BMET - order given today.   Please do in approximately 2 weeks.   Office will contact with results via phone or letter.    Testing/Procedures: none  Follow-Up: 10 weeks   Any Other Special Instructions Will Be Listed Below (If Applicable).  If you need a refill on your cardiac medications before your next appointment, please call your pharmacy.

## 2020-02-24 NOTE — Progress Notes (Addendum)
CARDIOLOGY CONSULT NOTE  Patient ID: Diamond Le MRN: [id number] DOB/AGE: [birth year] 54 y.o.  Admit date: (Not on file) Primary Physician: Fayrene Helper, MD  Reason for Consultation: Palpitations  HPI: Diamond Le is a 54 y.o. female who is being seen today for the evaluation of palpitations at the request of Fayrene Helper, MD.   PMH includes hypertension.  I reviewed PCP notes. Symptoms have been ongoing for 5 weeks.  I reviewed recent labs which include normal renal function, A1C, and TSH.  ECG performed on 02/20/20 which I personally interpreted demonstrates normal sinus rhythm with no ischemic ST segment or T-wave abnormalities, nor any arrhythmias.  It appears she had been on minoxidil as prescribed by her PCP for the control of hypertension but then she developed leg swelling and palpitations.  Shortly before her visit to the ED she stopped it.  Symptoms have begun to improve and now she rarely has palpitations.  She also denies chest pain and exertional dyspnea.  She has reportedly been on hydrochlorothiazide in the past without adequate BP control for which reason it was switched to different antihypertensives by her PCP.  She works as a Marine scientist at [employer].   No Known Allergies  Current Outpatient Medications  Medication Sig Dispense Refill  . amLODipine (NORVASC) 10 MG tablet Take 10 mg by mouth daily.    . cholecalciferol (VITAMIN D3) 25 MCG (1000 UNIT) tablet Take 1,000 Units by mouth daily.    Marland Kitchen spironolactone (ALDACTONE) 50 MG tablet Take 1 tablet (50 mg total) by mouth daily. Dose increase effective 01/21/2020 30 tablet 4   No current facility-administered medications for this visit.    Past Medical History:  Diagnosis Date  . Folliculitis [id number]  . Hypertension   . Obesity   . Palpitation   . Superficial fungus infection of skin 04/19/2015  . Vitamin D deficiency     Past Surgical History:  Procedure Laterality Date  .  BREAST CYST EXCISION Right 12/14/2016  . MASS EXCISION Right 12/14/2016   Procedure: EXCISION CYST RIGHT AXILLA;  Surgeon: Aviva Signs, MD;  Location: [facility];  Service: General;  Laterality: Right;    Social History   Socioeconomic History  . Marital status: Married    Spouse name: Not on file  . Number of children: 2  . Years of education: Not on file  . Highest education level: Not on file  Occupational History  . Occupation: Marine scientist at [employer]  . Smoking status: Never Smoker  . Smokeless tobacco: Never Used  Substance and Sexual Activity  . Alcohol use: No  . Drug use: No  . Sexual activity: Yes    Birth control/protection: None, Condom  Other Topics Concern  . Not on file  Social History Narrative  . Not on file   Social Determinants of Health   Financial Resource Strain:   . Difficulty of Paying Living Expenses:   Food Insecurity:   . Worried About Charity fundraiser in the Last Year:   . Arboriculturist in the Last Year:   Transportation Needs:   . Film/video editor (Medical):   Marland Kitchen Lack of Transportation (Non-Medical):   Physical Activity:   . Days of Exercise per Week:   . Minutes of Exercise per Session:   Stress:   . Feeling of Stress :   Social Connections:   . Frequency of Communication with Friends and Family:   .  Frequency of Social Gatherings with Friends and Family:   . Attends Religious Services:   . Active Member of Clubs or Organizations:   . Attends Archivist Meetings:   Marland Kitchen Marital Status:   Intimate Partner Violence:   . Fear of Current or Ex-Partner:   . Emotionally Abused:   Marland Kitchen Physically Abused:   . Sexually Abused:      No family history of premature CAD in 1st degree relatives.  Current Meds  Medication Sig  . amLODipine (NORVASC) 10 MG tablet Take 10 mg by mouth daily.  . cholecalciferol (VITAMIN D3) 25 MCG (1000 UNIT) tablet Take 1,000 Units by mouth daily.  Marland Kitchen spironolactone (ALDACTONE) 50 MG tablet  Take 1 tablet (50 mg total) by mouth daily. Dose increase effective 01/21/2020      Review of systems complete and found to be negative unless listed above in HPI   [facility], LPN was present throughout the entirety of the encounter.  Physical exam Blood pressure (!) 152/100, pulse 79, temperature (!) 97.5 F (36.4 C), height 5\' 6"  (1.676 m), weight 237 lb (107.5 kg), last menstrual period 01/30/2020, SpO2 97 %. General: NAD Neck: No JVD, no thyromegaly or thyroid nodule.  Lungs: Clear to auscultation bilaterally with normal respiratory effort. CV: Nondisplaced PMI. Regular rate and rhythm, normal S1/S2, no S3/S4, no murmur.  No peripheral edema.  No carotid bruit. 2+ radial pulses bilaterally. Abdomen: Soft, nontender, no distention.  Skin: Intact without lesions or rashes.  Neurologic: Alert and oriented x 3.  Psych: Normal affect. Extremities: No clubbing or cyanosis.  HEENT: Normal.   ECG: Most recent ECG reviewed.   Labs: Lab Results  Component Value Date/Time   K 3.7 02/20/2020 04:44 PM   BUN 10 02/20/2020 04:44 PM   CREATININE 0.95 02/20/2020 04:44 PM   CREATININE 0.92 01/25/2020 09:33 AM   ALT 21 02/20/2020 04:44 PM   TSH 2.56 01/25/2020 09:33 AM   HGB 12.1 02/20/2020 04:44 PM     Lipids: Lab Results  Component Value Date/Time   LDLCALC 108 (H) 01/25/2020 09:33 AM   CHOL 174 01/25/2020 09:33 AM   TRIG 84 01/25/2020 09:33 AM   HDL 49 (L) 01/25/2020 09:33 AM        ASSESSMENT AND PLAN:   1. Palpitations: This appears to have been due to minoxidil and symptoms have essentially resolved with its cessation.  I will start lisinopril 5 mg daily.  I will check a basic metabolic panel in 2 weeks.    2. HTN: BP is markedly elevated today.    Disposition: Follow up virtual visit 10 weeks  Signed: Kate Sable, M.D., F.A.C.C.  02/24/2020, 1:36 PM

## 2020-02-24 NOTE — Telephone Encounter (Signed)

## 2020-03-23 ENCOUNTER — Ambulatory Visit: Payer: Self-pay | Admitting: Family Medicine

## 2020-04-05 ENCOUNTER — Ambulatory Visit (INDEPENDENT_AMBULATORY_CARE_PROVIDER_SITE_OTHER): Payer: Self-pay

## 2020-04-05 ENCOUNTER — Other Ambulatory Visit: Payer: Self-pay

## 2020-04-05 ENCOUNTER — Ambulatory Visit
Admission: EM | Admit: 2020-04-05 | Discharge: 2020-04-05 | Disposition: A | Discharge status: Home / Self Care | Payer: Self-pay | Attending: Emergency Medicine | Admitting: Emergency Medicine

## 2020-04-05 DIAGNOSIS — R109 Unspecified abdominal pain: Secondary | ICD-10-CM

## 2020-04-05 DIAGNOSIS — R0602 Shortness of breath: Secondary | ICD-10-CM

## 2020-04-05 DIAGNOSIS — M549 Dorsalgia, unspecified: Secondary | ICD-10-CM

## 2020-04-05 DIAGNOSIS — R142 Eructation: Secondary | ICD-10-CM | POA: Insufficient documentation

## 2020-04-05 DIAGNOSIS — R140 Abdominal distension (gaseous): Secondary | ICD-10-CM | POA: Insufficient documentation

## 2020-04-05 LAB — POCT URINALYSIS DIP (MANUAL ENTRY)
Bilirubin, UA: NEGATIVE
Glucose, UA: NEGATIVE mg/dL
Ketones, POC UA: NEGATIVE mg/dL
Leukocytes, UA: NEGATIVE
Nitrite, UA: NEGATIVE
Protein Ur, POC: NEGATIVE mg/dL
Spec Grav, UA: 1.02 (ref 1.010–1.025)
Urobilinogen, UA: 0.2 E.U./dL
pH, UA: 5 (ref 5.0–8.0)

## 2020-04-05 LAB — POCT URINE PREGNANCY: Preg Test, Ur: NEGATIVE

## 2020-04-05 MED ORDER — DICYCLOMINE HCL 20 MG PO TABS: 20.0000 mg | ORAL_TABLET | Freq: Two times a day (BID) | ORAL | 0 refills | Status: DC

## 2020-04-05 NOTE — ED Triage Notes (Signed)
Pt presents with c/o right upper quadrant and right flank pain with some sob and bloating. Pt c/o belching frequently and pain radiates to back

## 2020-04-05 NOTE — ED Provider Notes (Signed)
[facility]   [id number] 04/05/20 Arrival Time: [id number]  CC: ABDOMINAL DISCOMFORT  SUBJECTIVE:  Moxie Nguyet Lafontant is a 54 y.o. female who presents with complaint of RT flank discomfort that began abruptly 3 days ago.  Denies a precipitating event, trauma, URI symptoms.  Localizes pain to RT flank.  Describes as stable, intermittent,  and dull, and cramping in character.  3/10.  Has tried OTC medications tylenol without relief.  Denies alleviating factors.  Reports mild aggravating factors with eating.  Had panda express today and experienced symptoms.  Denies similar symptoms in the past.  Last BM this morning, normal for patient.  Complains of associated bloating, belching, nausea, and SOB.    Denies fever, chills, vomiting, chest pain, SOB, diarrhea, constipation, hematochezia, melena, dysuria, difficulty urinating, increased frequency or urgency, flank pain, loss of bowel or bladder function, vaginal discharge, vaginal odor, vaginal bleeding, dyspareunia, pelvic pain.     Patient's last menstrual period was 03/16/2020.  ROS: As per HPI.  All other pertinent ROS negative.     Past Medical History:  Diagnosis Date   Folliculitis [id number]   Hypertension    Obesity    Palpitation    Superficial fungus infection of skin 04/19/2015   Vitamin D deficiency    Past Surgical History:  Procedure Laterality Date   BREAST CYST EXCISION Right 12/14/2016   MASS EXCISION Right 12/14/2016   Procedure: EXCISION CYST RIGHT AXILLA;  Surgeon: Aviva Signs, MD;  Location: [facility];  Service: General;  Laterality: Right;   No Known Allergies No current facility-administered medications on file prior to encounter.   Current Outpatient Medications on File Prior to Encounter  Medication Sig Dispense Refill   amLODipine (NORVASC) 10 MG tablet Take 10 mg by mouth daily.     cholecalciferol (VITAMIN D3) 25 MCG (1000 UNIT) tablet Take 1,000 Units by mouth daily.     lisinopril (ZESTRIL) 5  MG tablet Take 1 tablet (5 mg total) by mouth daily. 30 tablet 6   spironolactone (ALDACTONE) 50 MG tablet Take 1 tablet (50 mg total) by mouth daily. Dose increase effective 01/21/2020 30 tablet 4   Social History   Socioeconomic History   Marital status: Married    Spouse name: Not on file   Number of children: 2   Years of education: Not on file   Highest education level: Not on file  Occupational History   Occupation: nurse at [employer]   Smoking status: Never Smoker   Smokeless tobacco: Never Used  Substance and Sexual Activity   Alcohol use: No   Drug use: No   Sexual activity: Yes    Birth control/protection: None, Condom  Other Topics Concern   Not on file  Social History Narrative   Not on file   Social Determinants of Health   Financial Resource Strain:    Difficulty of Paying Living Expenses:   Food Insecurity:    Worried About Charity fundraiser in the Last Year:    Arboriculturist in the Last Year:   Transportation Needs:    Film/video editor (Medical):    Lack of Transportation (Non-Medical):   Physical Activity:    Days of Exercise per Week:    Minutes of Exercise per Session:   Stress:    Feeling of Stress :   Social Connections:    Frequency of Communication with Friends and Family:    Frequency of Social Gatherings with Friends and Family:  Attends Religious Services:    Active Member of Clubs or Organizations:    Attends Music therapist:    Marital Status:   Intimate Partner Violence:    Fear of Current or Ex-Partner:    Emotionally Abused:    Physically Abused:    Sexually Abused:    Family History  Problem Relation Age of Onset   Asthma Mother        most of her life    Cancer Father        lung   Cancer Paternal Aunt        cervical    Hypertension Maternal Grandmother    Ovarian cancer Other      OBJECTIVE:  Vitals:   04/05/20 1442  BP: 132/89  Pulse: 91    Resp: 16  Temp: 98.3 F (36.8 C)  TempSrc: Oral  SpO2: 96%    General appearance: Alert; NAD HEENT: NCAT.  Oropharynx clear.  Lungs: clear to auscultation bilaterally without adventitious breath sounds Heart: regular rate and rhythm.   Abdomen: soft, non-distended; normal active bowel sounds; non-tender to light and deep palpation; nontender at McBurney's point; negative Murphy's sign; no guarding Back: no CVA tenderness Extremities: no edema; symmetrical with no gross deformities Skin: warm and dry Neurologic: normal gait Psychological: alert and cooperative; normal mood and affect  LABS: Results for orders placed or performed during the hospital encounter of 04/05/20 (from the past 24 hour(s))  POCT urine pregnancy     Status: None   Collection Time: 04/05/20  3:06 PM  Result Value Ref Range   Preg Test, Ur Negative Negative  POCT urinalysis dipstick     Status: Abnormal   Collection Time: 04/05/20  3:06 PM  Result Value Ref Range   Color, UA yellow yellow   Clarity, UA clear clear   Glucose, UA negative negative mg/dL   Bilirubin, UA negative negative   Ketones, POC UA negative negative mg/dL   Spec Grav, UA 1.020 1.010 - 1.025   Blood, UA trace-intact (A) negative   pH, UA 5.0 5.0 - 8.0   Protein Ur, POC negative negative mg/dL   Urobilinogen, UA 0.2 0.2 or 1.0 E.U./dL   Nitrite, UA Negative Negative   Leukocytes, UA Negative Negative    DIAGNOSTIC STUDIES: DG Abd Acute W/Chest  Result Date: 04/05/2020 CLINICAL DATA:  Right flank pain and back pain, shortness of breath EXAM: DG ABDOMEN ACUTE W/ 1V CHEST COMPARISON:  None. FINDINGS: There is no evidence of dilated bowel loops or free intraperitoneal air. No radiopaque calculi or other significant radiographic abnormality is seen. Heart size and mediastinal contours are within normal limits. Both lungs are clear. IMPRESSION: Negative abdominal radiographs.  No acute cardiopulmonary disease. Electronically Signed   By:  Rolm Baptise M.D.   On: 04/05/2020 15:29    I have reviewed the x-rays myself and the radiologist interpretation. I am in agreement with the radiologist interpretation.     ASSESSMENT & PLAN:  1. Right flank pain   2. Bloating   3. Belching     Meds ordered this encounter  Medications   dicyclomine (BENTYL) 20 MG tablet    Sig: Take 1 tablet (20 mg total) by mouth 2 (two) times daily.    Dispense:  20 tablet    Refill:  0    Order Specific Question:   Supervising Provider    Answer:   Raylene Everts [id number]   Urine without signs of infection Urine pregnancy negative  Abdominal x-ray negative Get rest and drink fluids Bentyl prescribed.  Take as directed for symptomatic relief Begin keeping a food diary to see if there are any associated foods that make symptoms worse Follow up with PCP for further evaluation and management.  May consider additional imaging or blood work at that time If you experience new or worsening symptoms return or go to ER such as fever, chills, nausea, vomiting, diarrhea, bloody or dark tarry stools, constipation, urinary symptoms, worsening abdominal discomfort, symptoms that do not improve with medications, inability to keep fluids down, etc...  Reviewed expectations re: course of current medical issues. Questions answered. Outlined signs and symptoms indicating need for more acute intervention. Patient verbalized understanding. After Visit Summary given.   Lestine Box, PA-C 04/05/20 1543

## 2020-04-05 NOTE — Discharge Instructions (Signed)
Urine without signs of infection Urine pregnancy negative Abdominal x-ray negative Get rest and drink fluids Bentyl prescribed.  Take as directed for symptomatic relief Begin keeping a food diary to see if there are any associated foods that make symptoms worse Follow up with PCP for further evaluation and management.  May consider additional imaging or blood work at that time If you experience new or worsening symptoms return or go to ER such as fever, chills, nausea, vomiting, diarrhea, bloody or dark tarry stools, constipation, urinary symptoms, worsening abdominal discomfort, symptoms that do not improve with medications, inability to keep fluids down, etc..Marland Kitchen

## 2020-04-06 LAB — URINE CULTURE: Culture: <10000 — AB

## 2020-05-17 ENCOUNTER — Telehealth: Payer: Self-pay | Admitting: Cardiovascular Disease

## 2020-05-19 ENCOUNTER — Telehealth: Payer: Self-pay

## 2020-05-19 NOTE — Telephone Encounter (Signed)
PT LVM that she needs amlodipine 10 mg called in

## 2020-05-23 ENCOUNTER — Other Ambulatory Visit: Payer: Self-pay

## 2020-05-23 MED ORDER — AMLODIPINE BESYLATE 10 MG PO TABS: 10.0000 mg | ORAL_TABLET | Freq: Every day | ORAL | 5 refills | Status: DC

## 2020-05-23 NOTE — Telephone Encounter (Signed)
Med refill sent

## 2020-06-09 ENCOUNTER — Ambulatory Visit: Payer: Self-pay | Admitting: Family Medicine

## 2020-06-21 ENCOUNTER — Ambulatory Visit: Payer: Self-pay | Admitting: Family Medicine

## 2020-07-26 ENCOUNTER — Other Ambulatory Visit: Payer: Self-pay | Admitting: Family Medicine

## 2020-07-27 ENCOUNTER — Ambulatory Visit: Payer: Self-pay | Admitting: Family Medicine

## 2020-09-16 ENCOUNTER — Other Ambulatory Visit: Payer: Self-pay | Admitting: Family Medicine

## 2020-09-16 DIAGNOSIS — Z1231 Encounter for screening mammogram for malignant neoplasm of breast: Secondary | ICD-10-CM

## 2020-09-27 ENCOUNTER — Other Ambulatory Visit: Payer: Self-pay

## 2020-09-27 ENCOUNTER — Ambulatory Visit (INDEPENDENT_AMBULATORY_CARE_PROVIDER_SITE_OTHER): Payer: Commercial Managed Care - PPO | Admitting: Family Medicine

## 2020-09-27 ENCOUNTER — Encounter (INDEPENDENT_AMBULATORY_CARE_PROVIDER_SITE_OTHER): Payer: Self-pay | Admitting: *Deleted

## 2020-09-27 ENCOUNTER — Encounter: Payer: Self-pay | Admitting: Family Medicine

## 2020-09-27 VITALS — BP 134/81 | HR 90 | Resp 16 | Ht 66.0 in | Wt 240.0 lb

## 2020-09-27 DIAGNOSIS — Z1159 Encounter for screening for other viral diseases: Secondary | ICD-10-CM

## 2020-09-27 DIAGNOSIS — M546 Pain in thoracic spine: Secondary | ICD-10-CM | POA: Diagnosis not present

## 2020-09-27 DIAGNOSIS — E669 Obesity, unspecified: Secondary | ICD-10-CM

## 2020-09-27 DIAGNOSIS — D539 Nutritional anemia, unspecified: Secondary | ICD-10-CM

## 2020-09-27 DIAGNOSIS — N62 Hypertrophy of breast: Secondary | ICD-10-CM | POA: Diagnosis not present

## 2020-09-27 DIAGNOSIS — Z1211 Encounter for screening for malignant neoplasm of colon: Secondary | ICD-10-CM

## 2020-09-27 DIAGNOSIS — I10 Essential (primary) hypertension: Secondary | ICD-10-CM

## 2020-09-27 DIAGNOSIS — E559 Vitamin D deficiency, unspecified: Secondary | ICD-10-CM

## 2020-09-27 DIAGNOSIS — D508 Other iron deficiency anemias: Secondary | ICD-10-CM

## 2020-09-27 MED ORDER — IRON 325 (65 FE) MG PO TABS: ORAL_TABLET | ORAL | 1 refills | Status: DC

## 2020-09-27 NOTE — Assessment & Plan Note (Signed)
Controlled, no change in medication DASH diet and commitment to daily physical activity for a minimum of 30 minutes discussed and encouraged, as a part of hypertension management. The importance of attaining a healthy weight is also discussed.  BP/Weight 09/27/2020 04/05/2020 02/24/2020 02/20/2020 01/21/2020 12/22/2019 12/20/869  Systolic BP 994 129 047 533 [phone]  Diastolic BP 81 89 754 73 90 78 78  Wt. (Lbs) 240 - 237 243 243 229 229  BMI 38.74 - 38.25 39.22 39.22 38.11 38.11

## 2020-09-27 NOTE — Assessment & Plan Note (Signed)
Enlarged breast current size 42 F?? Crying to put on bra

## 2020-09-27 NOTE — Assessment & Plan Note (Signed)
Takes once daily iron 325 mg, re evaluate iron in 4 months

## 2020-09-27 NOTE — Patient Instructions (Addendum)
F/U early March with MD, call if you need me sooner, zostrixcx #1 at visit  Start OTC iron 338m one daily  Please get fasting chem 7 and eGFR, Hep C screen , vit D, cbc, iron and ferritin 1 week before f/u  You are referred  To plastic surgery    It is important that you exercise regularly at least 30 minutes 5 times a week. If you develop chest pain, have severe difficulty breathing, or feel very tired, stop exercising immediately and seek medical attention   Think about what you will eat, plan ahead.  Stop sodas Drink only water 64 ounces /day Stop eating at 7 pm  Weight loss goal of 12 to 16 pounds Choose " clean, green, fresh or frozen" over canned, processed or packaged foods which are more sugary, salty and fatty. 70 to 75% of food eaten should be vegetables and fruit. Three meals at set times with snacks allowed between meals, but they must be fruit or vegetables. Aim to eat over a 12 hour period , example 7 am to 7 pm, and STOP after  your last meal of the day. Drink water,generally about 64 ounces per day, no other drink is as healthy. Fruit juice is best enjoyed in a healthy way, by EATING the fruit.    Perimenopause  Perimenopause is the normal time of life before and after menstrual periods stop completely (menopause). Perimenopause can begin 2-8 years before menopause, and it usually lasts for 1 year after menopause. During perimenopause, the ovaries may or may not produce an egg. What are the causes? This condition is caused by a natural change in hormone levels that happens as you get older. What increases the risk? This condition is more likely to start at an earlier age if you have certain medical conditions or treatments, including:  A tumor of the pituitary gland in the brain.  A disease that affects the ovaries and hormone production.  Radiation treatment for cancer.  Certain cancer treatments, such as chemotherapy or hormone (anti-estrogen)  therapy.  Heavy smoking and excessive alcohol use.  Family history of early menopause. What are the signs or symptoms? Perimenopausal changes affect each woman differently. Symptoms of this condition may include:  Hot flashes.  Night sweats.  Irregular menstrual periods.  Decreased sex drive.  Vaginal dryness.  Headaches.  Mood swings.  Depression.  Memory problems or trouble concentrating.  Irritability.  Tiredness.  Weight gain.  Anxiety.  Trouble getting pregnant. How is this diagnosed? This condition is diagnosed based on your medical history, a physical exam, your age, your menstrual history, and your symptoms. Hormone tests may also be done. How is this treated? In some cases, no treatment is needed. You and your health care provider should make a decision together about whether treatment is necessary. Treatment will be based on your individual condition and preferences. Various treatments are available, such as:  Menopausal hormone therapy (MHT).  Medicines to treat specific symptoms.  Acupuncture.  Vitamin or herbal supplements. Before starting treatment, make sure to let your health care provider know if you have a personal or family history of:  Heart disease.  Breast cancer.  Blood clots.  Diabetes.  Osteoporosis. Follow these instructions at home: Lifestyle  Do not use any products that contain nicotine or tobacco, such as cigarettes and e-cigarettes. If you need help quitting, ask your health care provider.  Eat a balanced diet that includes fresh fruits and vegetables, whole grains, soybeans, eggs, lean meat, and  low-fat dairy.  Get at least 30 minutes of physical activity on 5 or more days each week.  Avoid alcoholic and caffeinated beverages, as well as spicy foods. This may help prevent hot flashes.  Get 7-8 hours of sleep each night.  Dress in layers that can be removed to help you manage hot flashes.  Find ways to manage  stress, such as deep breathing, meditation, or journaling. General instructions  Keep track of your menstrual periods, including: ? When they occur. ? How heavy they are and how long they last. ? How much time passes between periods.  Keep track of your symptoms, noting when they start, how often you have them, and how long they last.  Take over-the-counter and prescription medicines only as told by your health care provider.  Take vitamin supplements only as told by your health care provider. These may include calcium, vitamin E, and vitamin D.  Use vaginal lubricants or moisturizers to help with vaginal dryness and improve comfort during sex.  Talk with your health care provider before starting any herbal supplements.  Keep all follow-up visits as told by your health care provider. This is important. This includes any group therapy or counseling. Contact a health care provider if:  You have heavy vaginal bleeding or pass blood clots.  Your period lasts more than 2 days longer than normal.  Your periods are recurring sooner than 21 days.  You bleed after having sex. Get help right away if:  You have chest pain, trouble breathing, or trouble talking.  You have severe depression.  You have pain when you urinate.  You have severe headaches.  You have vision problems. Summary  Perimenopause is the time when a woman's body begins to move into menopause. This may happen naturally or as a result of other health problems or medical treatments.  Perimenopause can begin 2-8 years before menopause, and it usually lasts for 1 year after menopause.  Perimenopausal symptoms can be managed through medicines, lifestyle changes, and complementary therapies such as acupuncture. This information is not intended to replace advice given to you by your health care provider. Make sure you discuss any questions you have with your health care provider. Document Revised: 10/25/2017 Document  Reviewed: 12/18/2016 Elsevier Patient Education  2020 [employer].

## 2020-09-27 NOTE — Assessment & Plan Note (Signed)
Referred for colonoscopy

## 2020-09-27 NOTE — Assessment & Plan Note (Signed)
Updated lab needed at/ before next visit.   

## 2020-09-27 NOTE — Assessment & Plan Note (Signed)
Rated at 8 due to enlarged  Breasts , refer to plastic

## 2020-09-27 NOTE — Progress Notes (Signed)
Diamond Le     MRN: [id number]      DOB: 11/03/1966   HPI Diamond Le is here for follow up and re-evaluation of chronic medical conditions, medication management and review of any available recent lab and radiology data.  Preventive health is updated, specifically  Cancer screening and Immunization.   Questions or concerns regarding consultations or procedures which the PT has had in the interim are  addressed. The PT denies any adverse reactions to current medications since the last visit.  C.//o upper back pain due to enalrged breasts wants referreal for surgery C/o weight gain, no exercise and eating late as well as sodas 1 month h/o hot flashes, still bleeds monthly and heavily ROS Denies recent fever or chills. Denies sinus pressure, nasal congestion, ear pain or sore throat. Denies chest congestion, productive cough or wheezing. Denies chest pains, palpitations and leg swelling Denies abdominal pain, nausea, vomiting,diarrhea or constipation.   Denies dysuria, frequency, hesitancy or incontinence. ty. Denies headaches, seizures, numbness, or tingling. Denies depression, anxiety or insomnia. Denies skin break down or rash.   PE  BP 134/81   Pulse 90   Resp 16   Ht 5\' 6"  (1.676 m)   Wt 240 lb (108.9 kg)   SpO2 97%   BMI 38.74 kg/m   Patient alert and oriented and in no cardiopulmonary distress.  HEENT: No facial asymmetry, EOMI,     Neck supple .  Chest: Clear to auscultation bilaterally.  CVS: S1, S2 no murmurs, no S3.Regular rate.  ABD: Soft non tender.   Ext: No edema  MS: Adequate ROM spine, shoulders, hips and knees.  Skin: Intact, no ulcerations or rash noted.  Psych: Good eye contact, normal affect. Memory intact not anxious or depressed appearing.  CNS: CN 2-12 intact, power,  normal throughout.no focal deficits noted.   Assessment & Plan  Essential hypertension Controlled, no change in medication DASH diet and commitment to daily  physical activity for a minimum of 30 minutes discussed and encouraged, as a part of hypertension management. The importance of attaining a healthy weight is also discussed.  BP/Weight 09/27/2020 04/05/2020 02/24/2020 02/20/2020 01/21/2020 12/22/2019 4/97/0263  Systolic BP 785 885 027 [phone]  Diastolic BP 81 89 094 73 90 78 78  Wt. (Lbs) 240 - 237 243 243 229 229  BMI 38.74 - 38.25 39.22 39.22 38.11 38.11       Obesity (BMI 30-39.9)  Patient re-educated about  the importance of commitment to a  minimum of 150 minutes of exercise per week as able.  The importance of healthy food choices with portion control discussed, as well as eating regularly and within a 12 hour window most days. The need to choose "clean , green" food 50 to 75% of the time is discussed, as well as to make water the primary drink and set a goal of 64 ounces water daily.    Weight /BMI 09/27/2020 02/24/2020 02/20/2020  WEIGHT 240 lb 237 lb 243 lb  HEIGHT 5\' 6"  5\' 6"  5\' 6"   BMI 38.74 kg/m2 38.25 kg/m2 39.22 kg/m2      Thoracic back pain Rated at 8 due to enlarged  Breasts , refer to plastic   Macromastia Enlarged breast current size 42 F?? Crying to put on bra  Vitamin D deficiency Updated lab needed at/ before next visit.   Special screening for malignant neoplasms, colon Referred for colonoscopy  IDA (iron deficiency anemia) Takes once daily iron 325 mg, re  evaluate iron in 4 months

## 2020-09-27 NOTE — Assessment & Plan Note (Signed)
  Patient re-educated about  the importance of commitment to a  minimum of 150 minutes of exercise per week as able.  The importance of healthy food choices with portion control discussed, as well as eating regularly and within a 12 hour window most days. The need to choose "clean , green" food 50 to 75% of the time is discussed, as well as to make water the primary drink and set a goal of 64 ounces water daily.    Weight /BMI 09/27/2020 02/24/2020 02/20/2020  WEIGHT 240 lb 237 lb 243 lb  HEIGHT 5\' 6"  5\' 6"  5\' 6"   BMI 38.74 kg/m2 38.25 kg/m2 39.22 kg/m2

## 2020-10-06 ENCOUNTER — Other Ambulatory Visit: Payer: Self-pay | Admitting: Family Medicine

## 2020-10-06 DIAGNOSIS — Z1231 Encounter for screening mammogram for malignant neoplasm of breast: Secondary | ICD-10-CM

## 2020-10-07 ENCOUNTER — Other Ambulatory Visit: Payer: Self-pay | Admitting: Family Medicine

## 2020-10-07 DIAGNOSIS — Z1231 Encounter for screening mammogram for malignant neoplasm of breast: Secondary | ICD-10-CM

## 2020-10-25 ENCOUNTER — Ambulatory Visit: Payer: Self-pay

## 2020-11-09 ENCOUNTER — Encounter: Payer: Self-pay | Admitting: Plastic Surgery

## 2020-11-09 ENCOUNTER — Other Ambulatory Visit: Payer: Self-pay

## 2020-11-09 ENCOUNTER — Ambulatory Visit: Payer: Commercial Managed Care - PPO | Admitting: Plastic Surgery

## 2020-11-09 VITALS — BP 151/92 | HR 89 | Temp 98.6°F | Ht 66.0 in | Wt 235.0 lb

## 2020-11-09 DIAGNOSIS — M546 Pain in thoracic spine: Secondary | ICD-10-CM | POA: Diagnosis not present

## 2020-11-09 DIAGNOSIS — M4004 Postural kyphosis, thoracic region: Secondary | ICD-10-CM

## 2020-11-09 DIAGNOSIS — M545 Low back pain, unspecified: Secondary | ICD-10-CM

## 2020-11-09 DIAGNOSIS — N62 Hypertrophy of breast: Secondary | ICD-10-CM

## 2020-11-09 NOTE — Progress Notes (Signed)
Referring Provider Fayrene Helper, MD [address], [place],  [place] [address]   CC:  Chief Complaint  Patient presents with  . Advice Only      Diamond Le is an 54 y.o. female.  HPI: Patient presents to discuss breast reduction.  She is had years of back pain, neck pain and shoulder grooving related to her large breasts.  She is currently 40 double D and wants to be around a C cup.  She has no family history of breast cancer.  Her last mammogram a mammogram was over a year ago and she has appointment at the end of this week but has not had any previous abnormal findings.  She has not had any previous breast biopsies or procedures.  She tried over-the-counter counter medications, warm packs, cold packs and supportive bras with little relief to her back pain.  She also gets rashes beneath her breast.  She does not smoke is not diabetic.  No Known Allergies  Outpatient Encounter Medications as of 11/09/2020  Medication Sig  . amLODipine (NORVASC) 10 MG tablet Take 1 tablet (10 mg total) by mouth daily.  . cholecalciferol (VITAMIN D3) 25 MCG (1000 UNIT) tablet Take 1,000 Units by mouth daily.  . Ferrous Sulfate (IRON) 325 (65 Fe) MG TABS Take one tablet by mouth once daily  . spironolactone (ALDACTONE) 50 MG tablet TAKE 1 TABLET(50 MG) BY MOUTH DAILY  . lisinopril (ZESTRIL) 5 MG tablet Take 1 tablet (5 mg total) by mouth daily.  . [DISCONTINUED] dicyclomine (BENTYL) 20 MG tablet Take 1 tablet (20 mg total) by mouth 2 (two) times daily.   No facility-administered encounter medications on file as of 11/09/2020.     Past Medical History:  Diagnosis Date  . Folliculitis 6/60/6301  . Hypertension   . Obesity   . Palpitation   . Superficial fungus infection of skin 04/19/2015  . Vitamin D deficiency     Past Surgical History:  Procedure Laterality Date  . BREAST CYST EXCISION Right 12/14/2016  . MASS EXCISION Right 12/14/2016   Procedure: EXCISION CYST RIGHT  AXILLA;  Surgeon: Aviva Signs, MD;  Location: [facility];  Service: General;  Laterality: Right;    Family History  Problem Relation Age of Onset  . Asthma Mother        most of her life   . Cancer Father        lung  . Cancer Paternal Aunt        cervical   . Hypertension Maternal Grandmother   . Ovarian cancer Other     Social History   Social History Narrative  . Not on file     Review of Systems General: Denies fevers, chills, weight loss CV: Denies chest pain, shortness of breath, palpitations  Physical Exam Vitals with BMI 11/09/2020 09/27/2020 04/05/2020  Height 5\' 6"  5\' 6"  -  Weight 235 lbs 240 lbs -  BMI 60.10 93.23 -  Systolic 557 322 025  Diastolic 92 81 89  Pulse 89 90 91    General:  No acute distress,  Alert and oriented, Non-Toxic, Normal speech and affect Breast: She has grade 3 ptosis.  Sternal notch to nipple is 35 cm bilaterally.  Nipple to fold is 16 cm on the right and 17 cm on the left.  I do not see any obvious scars or masses.  Assessment/Plan The patient has bilateral symptomatic macromastia.  She is a good candidate for a breast reduction.  She is interested in pursuing surgical treatment.  She has tried supportive garments and fitted bras with no relief.  The details of breast reduction surgery were discussed.  I explained the procedure in detail along the with the expected scars.  The risks were discussed in detail and include bleeding, infection, damage to surrounding structures, need for additional procedures, nipple loss, change in nipple sensation, persistent pain, contour irregularities and asymmetries.  I explained that breast feeding is often not possible after breast reduction surgery.  We discussed the expected postoperative course with an overall recovery period of about 1 month.  She demonstrated full understanding of all risks.  We discussed her personal risk factors.  I anticipate approximately 950g of tissue removed from each  side.   [name] 11/09/2020, 4:30 PM

## 2020-11-11 ENCOUNTER — Ambulatory Visit
Admission: RE | Admit: 2020-11-11 | Discharge: 2020-11-11 | Disposition: A | Discharge status: Home / Self Care | Payer: Commercial Managed Care - PPO | Source: Ambulatory Visit | Attending: Family Medicine | Admitting: Family Medicine

## 2020-11-11 ENCOUNTER — Other Ambulatory Visit: Payer: Self-pay

## 2020-11-11 DIAGNOSIS — Z1231 Encounter for screening mammogram for malignant neoplasm of breast: Secondary | ICD-10-CM

## 2020-12-26 ENCOUNTER — Other Ambulatory Visit: Payer: Self-pay | Admitting: Family Medicine

## 2020-12-26 DIAGNOSIS — Z1231 Encounter for screening mammogram for malignant neoplasm of breast: Secondary | ICD-10-CM

## 2020-12-29 ENCOUNTER — Other Ambulatory Visit: Payer: Self-pay

## 2020-12-29 ENCOUNTER — Ambulatory Visit
Admission: RE | Admit: 2020-12-29 | Discharge: 2020-12-29 | Disposition: A | Discharge status: Home / Self Care | Payer: Commercial Managed Care - PPO | Source: Ambulatory Visit | Attending: Family Medicine | Admitting: Family Medicine

## 2020-12-29 DIAGNOSIS — Z1231 Encounter for screening mammogram for malignant neoplasm of breast: Secondary | ICD-10-CM

## 2020-12-30 ENCOUNTER — Encounter: Payer: Self-pay | Admitting: Internal Medicine

## 2020-12-30 ENCOUNTER — Ambulatory Visit (INDEPENDENT_AMBULATORY_CARE_PROVIDER_SITE_OTHER): Payer: Commercial Managed Care - PPO | Admitting: Internal Medicine

## 2020-12-30 VITALS — BP 157/89 | HR 86 | Temp 98.6°F | Resp 20 | Ht 66.0 in | Wt 234.0 lb

## 2020-12-30 DIAGNOSIS — M545 Low back pain, unspecified: Secondary | ICD-10-CM

## 2020-12-30 DIAGNOSIS — R109 Unspecified abdominal pain: Secondary | ICD-10-CM

## 2020-12-30 DIAGNOSIS — N951 Menopausal and female climacteric states: Secondary | ICD-10-CM | POA: Diagnosis not present

## 2020-12-30 MED ORDER — DICYCLOMINE HCL 10 MG PO CAPS: 10.0000 mg | ORAL_CAPSULE | Freq: Three times a day (TID) | ORAL | 0 refills | Status: DC | PRN

## 2020-12-30 MED ORDER — SIMETHICONE 80 MG PO TABS
1.0000 | ORAL_TABLET | Freq: Two times a day (BID) | ORAL | 0 refills | Status: DC | PRN
Start: 2020-12-30 — End: 2021-06-08

## 2020-12-30 NOTE — Patient Instructions (Signed)
Please take medications as prescribed.  Please try to stay hydrated by taking at least 2 liters of fluid in a day.  Okay to take Ibuprofen for menstrual cramping.  Apply heating pad or ice for back pain. Okay to apply back brace. Avoid heavy lifting.

## 2020-12-30 NOTE — Progress Notes (Signed)
Acute Office Visit  Subjective:    Patient ID: Diamond Le, female    DOB: 01-12-1966, 55 y.o.   MRN: [id number]  Chief Complaint  Patient presents with  . Bloated    Cramping intermittently x 1 week   . Back Pain    X 1 week     HPI Patient is in today for evaluation of abdominal bloating and cramping for 1 week. She also c/o back pain, intermittent, non-radiating and is worse with bloating. She denies any relationship of abdominal cramping with the food intake. Denies any nausea, vomiting, constipation, diarrhea, melena or hematochezia. She has been having hot flashes and menstrual cramping at times as well. She thinks her symptoms are mostly because of bloating, but is concerned as her sister was diagnosed with ovarian cancer. She denies any heavy bleeding, recent weight change or pelvic pain.  Past Medical History:  Diagnosis Date  . Folliculitis [id number]  . Hypertension   . Obesity   . Palpitation   . Superficial fungus infection of skin 04/19/2015  . Vitamin D deficiency     Past Surgical History:  Procedure Laterality Date  . BREAST CYST EXCISION Right 12/14/2016  . MASS EXCISION Right 12/14/2016   Procedure: EXCISION CYST RIGHT AXILLA;  Surgeon: Aviva Signs, MD;  Location: [facility];  Service: General;  Laterality: Right;    Family History  Problem Relation Age of Onset  . Asthma Mother        most of her life   . Cancer Father        lung  . Cancer Paternal Aunt        cervical   . Hypertension Maternal Grandmother   . Ovarian cancer Other     Social History   Socioeconomic History  . Marital status: Married    Spouse name: Not on file  . Number of children: 2  . Years of education: Not on file  . Highest education level: Not on file  Occupational History  . Occupation: Marine scientist at [employer]  . Smoking status: Never Smoker  . Smokeless tobacco: Never Used  Vaping Use  . Vaping Use: Never used  Substance and Sexual Activity  . Alcohol  use: No  . Drug use: No  . Sexual activity: Yes    Birth control/protection: None, Condom  Other Topics Concern  . Not on file  Social History Narrative  . Not on file   Social Determinants of Health   Financial Resource Strain: Not on file  Food Insecurity: Not on file  Transportation Needs: Not on file  Physical Activity: Not on file  Stress: Not on file  Social Connections: Not on file  Intimate Partner Violence: Not on file    Outpatient Medications Prior to Visit  Medication Sig Dispense Refill  . amLODipine (NORVASC) 10 MG tablet Take 1 tablet (10 mg total) by mouth daily. 30 tablet 5  . cholecalciferol (VITAMIN D3) 25 MCG (1000 UNIT) tablet Take 1,000 Units by mouth daily.    . Ferrous Sulfate (IRON) 325 (65 Fe) MG TABS Take one tablet by mouth once daily 90 tablet 1  . spironolactone (ALDACTONE) 50 MG tablet TAKE 1 TABLET(50 MG) BY MOUTH DAILY 30 tablet 4  . lisinopril (ZESTRIL) 5 MG tablet Take 1 tablet (5 mg total) by mouth daily. 30 tablet 6   No facility-administered medications prior to visit.    No Known Allergies  Review of Systems  Constitutional: Negative for chills  and fever.  HENT: Negative for congestion, sinus pressure, sinus pain and sore throat.   Eyes: Negative for pain and discharge.  Respiratory: Negative for cough and shortness of breath.   Cardiovascular: Negative for chest pain and palpitations.  Gastrointestinal: Negative for constipation, diarrhea, nausea and vomiting.       Abdominal cramping  Endocrine: Negative for polydipsia and polyuria.  Genitourinary: Negative for dysuria and hematuria.  Musculoskeletal: Positive for back pain. Negative for neck pain and neck stiffness.  Skin: Negative for rash.  Neurological: Negative for dizziness and weakness.  Psychiatric/Behavioral: Negative for agitation and behavioral problems.       Objective:    Physical Exam Vitals reviewed.  Constitutional:      General: She is not in acute  distress.    Appearance: She is not diaphoretic.  HENT:     Head: Normocephalic and atraumatic.     Nose: Nose normal.     Mouth/Throat:     Mouth: Mucous membranes are moist.  Eyes:     General: No scleral icterus.    Extraocular Movements: Extraocular movements intact.     Pupils: Pupils are equal, round, and reactive to light.  Cardiovascular:     Rate and Rhythm: Normal rate and regular rhythm.     Pulses: Normal pulses.     Heart sounds: No murmur heard.   Pulmonary:     Breath sounds: Normal breath sounds. No wheezing or rales.  Abdominal:     General: Bowel sounds are normal. There is no distension.     Palpations: Abdomen is soft.     Tenderness: There is no abdominal tenderness. There is no guarding or rebound.  Musculoskeletal:     Cervical back: Neck supple. No tenderness.     Right lower leg: No edema.     Left lower leg: No edema.  Skin:    General: Skin is warm.     Findings: No rash.  Neurological:     General: No focal deficit present.     Mental Status: She is alert and oriented to person, place, and time.  Psychiatric:        Mood and Affect: Mood normal.        Behavior: Behavior normal.     BP (!) 157/89   Pulse 86   Temp 98.6 F (37 C)   Resp 20   Ht 5\' 6"  (1.676 m)   Wt 234 lb (106.1 kg)   SpO2 96%   BMI 37.77 kg/m  Wt Readings from Last 3 Encounters:  12/30/20 234 lb (106.1 kg)  11/09/20 235 lb (106.6 kg)  09/27/20 240 lb (108.9 kg)    Health Maintenance Due  Topic Date Due  . Hepatitis C Screening  Never done  . COLONOSCOPY (Pts 45-77yrs Insurance coverage will need to be confirmed)  Never done  . COVID-19 Vaccine (3 - Booster for Moderna series) 08/19/2020    There are no preventive care reminders to display for this patient.   Lab Results  Component Value Date   TSH 2.56 01/25/2020   Lab Results  Component Value Date   WBC 6.1 02/20/2020   HGB 12.1 02/20/2020   HCT 36.9 02/20/2020   MCV 85.6 02/20/2020   PLT 292  02/20/2020   Lab Results  Component Value Date   NA 137 02/20/2020   K 3.7 02/20/2020   CO2 27 02/20/2020   GLUCOSE 135 (H) 02/20/2020   BUN 10 02/20/2020   CREATININE 0.95 02/20/2020  BILITOT 0.5 02/20/2020   ALKPHOS 73 02/20/2020   AST 24 02/20/2020   ALT 21 02/20/2020   PROT 7.4 02/20/2020   ALBUMIN 4.1 02/20/2020   CALCIUM 9.1 02/20/2020   ANIONGAP 8 02/20/2020   Lab Results  Component Value Date   CHOL 174 01/25/2020   Lab Results  Component Value Date   HDL 49 (L) 01/25/2020   Lab Results  Component Value Date   LDLCALC 108 (H) 01/25/2020   Lab Results  Component Value Date   TRIG 84 01/25/2020   Lab Results  Component Value Date   CHOLHDL 3.6 01/25/2020   Lab Results  Component Value Date   HGBA1C 4.7 01/25/2020       Assessment & Plan:   Problem List Items Addressed This Visit   None   Visit Diagnoses    Abdominal cramping    -  Primary Unclear etiology currently Could be related to specific diet, including dairy products Prescribed Bentyl PRN Advised for proper hydration to avoid constipation Simethicone for bloating Will plan for imaging if persistent symptoms   Relevant Medications   dicyclomine (BENTYL) 10 MG capsule   Simethicone 80 MG TABS   Perimenopause     Appears to have hot flashes, but still has menstrual cycles Cramping could be related to menstruation as well Advised to take Ibuprofen for menstrual cramps    Low back pain, unspecified back pain laterality, unspecified chronicity, unspecified whether sciatica present     Advised simple back exercises, material provided Heating pads or ice Avoid heavy lifting Bloating and/or constipation can make back pain worse, advised to take Colace PRN for constipation       Meds ordered this encounter  Medications  . dicyclomine (BENTYL) 10 MG capsule    Sig: Take 1 capsule (10 mg total) by mouth 3 (three) times daily as needed for spasms (Cramping).    Dispense:  20 capsule     Refill:  0  . Simethicone 80 MG TABS    Sig: Take 1 tablet (80 mg total) by mouth 2 (two) times daily as needed (Bloating).    Dispense:  20 tablet    Refill:  0     Rily Nickey Keith Rake, MD

## 2021-01-18 ENCOUNTER — Telehealth (INDEPENDENT_AMBULATORY_CARE_PROVIDER_SITE_OTHER): Payer: Commercial Managed Care - PPO | Admitting: Family Medicine

## 2021-01-18 ENCOUNTER — Other Ambulatory Visit: Payer: Self-pay | Admitting: Family Medicine

## 2021-01-18 ENCOUNTER — Encounter: Payer: Self-pay | Admitting: Family Medicine

## 2021-01-18 ENCOUNTER — Other Ambulatory Visit: Payer: Self-pay

## 2021-01-18 ENCOUNTER — Encounter: Payer: Self-pay | Admitting: Surgical

## 2021-01-18 ENCOUNTER — Ambulatory Visit (INDEPENDENT_AMBULATORY_CARE_PROVIDER_SITE_OTHER): Payer: Commercial Managed Care - PPO | Admitting: Surgical

## 2021-01-18 VITALS — Ht 65.0 in | Wt 226.0 lb

## 2021-01-18 VITALS — BP 144/84 | HR 96 | Ht 65.0 in | Wt 234.0 lb

## 2021-01-18 DIAGNOSIS — M4004 Postural kyphosis, thoracic region: Secondary | ICD-10-CM

## 2021-01-18 DIAGNOSIS — E559 Vitamin D deficiency, unspecified: Secondary | ICD-10-CM

## 2021-01-18 DIAGNOSIS — Z1159 Encounter for screening for other viral diseases: Secondary | ICD-10-CM

## 2021-01-18 DIAGNOSIS — R1310 Dysphagia, unspecified: Secondary | ICD-10-CM

## 2021-01-18 DIAGNOSIS — K219 Gastro-esophageal reflux disease without esophagitis: Secondary | ICD-10-CM | POA: Diagnosis not present

## 2021-01-18 DIAGNOSIS — I10 Essential (primary) hypertension: Secondary | ICD-10-CM | POA: Diagnosis not present

## 2021-01-18 DIAGNOSIS — M546 Pain in thoracic spine: Secondary | ICD-10-CM

## 2021-01-18 DIAGNOSIS — E669 Obesity, unspecified: Secondary | ICD-10-CM

## 2021-01-18 DIAGNOSIS — R140 Abdominal distension (gaseous): Secondary | ICD-10-CM | POA: Diagnosis not present

## 2021-01-18 DIAGNOSIS — N62 Hypertrophy of breast: Secondary | ICD-10-CM

## 2021-01-18 DIAGNOSIS — M545 Low back pain, unspecified: Secondary | ICD-10-CM

## 2021-01-18 DIAGNOSIS — Z13220 Encounter for screening for lipoid disorders: Secondary | ICD-10-CM

## 2021-01-18 MED ORDER — ONDANSETRON HCL 4 MG PO TABS: 4.0000 mg | ORAL_TABLET | Freq: Three times a day (TID) | ORAL | 0 refills | Status: DC | PRN

## 2021-01-18 MED ORDER — PANTOPRAZOLE SODIUM 40 MG PO TBEC: 40.0000 mg | DELAYED_RELEASE_TABLET | Freq: Every day | ORAL | 1 refills | Status: DC

## 2021-01-18 MED ORDER — HYDROCODONE-ACETAMINOPHEN 5-325 MG PO TABS: 1.0000 | ORAL_TABLET | Freq: Four times a day (QID) | ORAL | 0 refills | Status: AC | PRN

## 2021-01-18 NOTE — Patient Instructions (Addendum)
F/U in 4 months, call if you need me before  Labs today, H pylori, CBC, lipid, cmp and EGFr, TSh and vit D and hepatitis c screen, pt coming in before midday  You are being referred for [place] gall bladder, we will call with appointment  You are being referred to [facility] for uncontrolled GERd with dysphagia and bloating  VERY IMPORTANT you fill out paperwork to get your colonoscopy   New is once daily protonix 40 mg for 2 months only     Gastroesophageal Reflux Disease, Adult  Gastroesophageal reflux (GER) happens when acid from the stomach flows up into the tube that connects the mouth and the stomach (esophagus). Normally, food travels down the esophagus and stays in the stomach to be digested. With GER, food and stomach acid sometimes move back up into the esophagus. You may have a disease called gastroesophageal reflux disease (GERD) if the reflux:  Happens often.  Causes frequent or very bad symptoms.  Causes problems such as damage to the esophagus. When this happens, the esophagus becomes sore and swollen. Over time, GERD can make small holes (ulcers) in the lining of the esophagus. What are the causes? This condition is caused by a problem with the muscle between the esophagus and the stomach. When this muscle is weak or not normal, it does not close properly to keep food and acid from coming back up from the stomach. The muscle can be weak because of:  Tobacco use.  Pregnancy.  Having a certain type of hernia (hiatal hernia).  Alcohol use.  Certain foods and drinks, such as coffee, chocolate, onions, and peppermint. What increases the risk?  Being overweight.  Having a disease that affects your connective tissue.  Taking NSAIDs, such a ibuprofen. What are the signs or symptoms?  Heartburn.  Difficult or painful swallowing.  The feeling of having a lump in the throat.  A bitter taste in the mouth.  Bad breath.  Having a lot of saliva.  Having an upset or  bloated stomach.  Burping.  Chest pain. Different conditions can cause chest pain. Make sure you see your doctor if you have chest pain.  Shortness of breath or wheezing.  A long-term cough or a cough at night.  Wearing away of the surface of teeth (tooth enamel).  Weight loss. How is this treated?  Making changes to your diet.  Taking medicine.  Having surgery. Treatment will depend on how bad your symptoms are. Follow these instructions at home: Eating and drinking  Follow a diet as told by your doctor. You may need to avoid foods and drinks such as: ? Coffee and tea, with or without caffeine. ? Drinks that contain alcohol. ? Energy drinks and sports drinks. ? Bubbly (carbonated) drinks or sodas. ? Chocolate and cocoa. ? Peppermint and mint flavorings. ? Garlic and onions. ? Horseradish. ? Spicy and acidic foods. These include peppers, chili powder, curry powder, vinegar, hot sauces, and BBQ sauce. ? Citrus fruit juices and citrus fruits, such as oranges, lemons, and limes. ? Tomato-based foods. These include red sauce, chili, salsa, and pizza with red sauce. ? Fried and fatty foods. These include donuts, french fries, potato chips, and high-fat dressings. ? High-fat meats. These include hot dogs, rib eye steak, sausage, ham, and bacon. ? High-fat dairy items, such as whole milk, butter, and cream cheese.  Eat small meals often. Avoid eating large meals.  Avoid drinking large amounts of liquid with your meals.  Avoid eating meals  during the 2-3 hours before bedtime.  Avoid lying down right after you eat.  Do not exercise right after you eat.   Lifestyle  Do not smoke or use any products that contain nicotine or tobacco. If you need help quitting, ask your doctor.  Try to lower your stress. If you need help doing this, ask your doctor.  If you are overweight, lose an amount of weight that is healthy for you. Ask your doctor about a safe weight loss goal.    General instructions  Pay attention to any changes in your symptoms.  Take over-the-counter and prescription medicines only as told by your doctor.  Do not take aspirin, ibuprofen, or other NSAIDs unless your doctor says it is okay.  Wear loose clothes. Do not wear anything tight around your waist.  Raise (elevate) the head of your bed about 6 inches (15 cm). You may need to use a wedge to do this.  Avoid bending over if this makes your symptoms worse.  Keep all follow-up visits. Contact a doctor if:  You have new symptoms.  You lose weight and you do not know why.  You have trouble swallowing or it hurts to swallow.  You have wheezing or a cough that keeps happening.  You have a hoarse voice.  Your symptoms do not get better with treatment. Get help right away if:  You have sudden pain in your arms, neck, jaw, teeth, or back.  You suddenly feel sweaty, dizzy, or light-headed.  You have chest pain or shortness of breath.  You vomit and the vomit is green, yellow, or black, or it looks like blood or coffee grounds.  You faint.  Your poop (stool) is red, bloody, or black.  You cannot swallow, drink, or eat. These symptoms may represent a serious problem that is an emergency. Do not wait to see if the symptoms will go away. Get medical help right away. Call your local emergency services (911 in the [place] Do not drive yourself to the hospital. Summary  If a person has gastroesophageal reflux disease (GERD), food and stomach acid move back up into the esophagus and cause symptoms or problems such as damage to the esophagus.  Treatment will depend on how bad your symptoms are.  Follow a diet as told by your doctor.  Take all medicines only as told by your doctor. This information is not intended to replace advice given to you by your health care provider. Make sure you discuss any questions you have with your health care provider. Document Revised: 05/23/2020  Document Reviewed: 05/23/2020 [employer] Patient Education  2021 [employer]  

## 2021-01-18 NOTE — Progress Notes (Signed)
Patient ID: Diamond Le, female    DOB: 08-11-1966, 55 y.o.   MRN: [id number]  Chief Complaint  Patient presents with  . Pre-op Exam      ICD-10-CM   1. Macromastia  N62   2. Back pain of thoracolumbar region  M54.50    M54.6   3. Postural kyphosis, thoracic region  M40.04      History of Present Illness: Diamond Le is a 55 y.o.  female  with a history of macromastia.  She presents for preoperative evaluation for upcoming procedure, Bilateral Breast Reduction, scheduled for 01/31/2021 with Dr.  Claudia Desanctis  The patient has not had problems with anesthesia. No history of DVT/PE.  No family history of DVT/PE.  No family or personal history of bleeding or clotting disorders.  Patient is not currently taking any blood thinners.  No history of CVA/MI.   Summary of Previous Visit: Patient has had years of back pain neck pain and shoulder grooving related to her large breasts.  She is currently 40 double D and wants to be around a C cup. She is not a smoker or diabetic.  She has not had any previous breast biopsies or procedures.  Anticipate approximately 950 g of tissue removed from each side.  Patient had a mammogram on 12/29/2020 which was negative.  Job: Midwife, desk job  PMH Significant for: Hypertension, vitamin D deficiency.  Past Medical History: Allergies: No Known Allergies  Current Medications:  Current Outpatient Medications:  .  HYDROcodone-acetaminophen (NORCO) 5-325 MG tablet, Take 1 tablet by mouth every 6 (six) hours as needed for up to 5 days for severe pain., Disp: 20 tablet, Rfl: 0 .  ondansetron (ZOFRAN) 4 MG tablet, Take 1 tablet (4 mg total) by mouth every 8 (eight) hours as needed for nausea or vomiting., Disp: 20 tablet, Rfl: 0 .  amLODipine (NORVASC) 10 MG tablet, Take 1 tablet (10 mg total) by mouth daily., Disp: 30 tablet, Rfl: 5 .  cholecalciferol (VITAMIN D3) 25 MCG (1000 UNIT) tablet, Take 1,000 Units by mouth daily., Disp: , Rfl:  .   dicyclomine (BENTYL) 10 MG capsule, Take 1 capsule (10 mg total) by mouth 3 (three) times daily as needed for spasms (Cramping)., Disp: 20 capsule, Rfl: 0 .  Ferrous Sulfate (IRON) 325 (65 Fe) MG TABS, Take one tablet by mouth once daily, Disp: 90 tablet, Rfl: 1 .  lisinopril (ZESTRIL) 5 MG tablet, Take 1 tablet (5 mg total) by mouth daily., Disp: 30 tablet, Rfl: 6 .  pantoprazole (PROTONIX) 40 MG tablet, Take 1 tablet (40 mg total) by mouth daily., Disp: 30 tablet, Rfl: 1 .  Simethicone 80 MG TABS, Take 1 tablet (80 mg total) by mouth 2 (two) times daily as needed (Bloating)., Disp: 20 tablet, Rfl: 0 .  spironolactone (ALDACTONE) 50 MG tablet, TAKE 1 TABLET(50 MG) BY MOUTH DAILY, Disp: 30 tablet, Rfl: 4  Past Medical Problems: Past Medical History:  Diagnosis Date  . Folliculitis 3/81/0175  . Hypertension   . Obesity   . Palpitation   . Superficial fungus infection of skin 04/19/2015  . Vitamin D deficiency     Past Surgical History: Past Surgical History:  Procedure Laterality Date  . BREAST CYST EXCISION Right 12/14/2016  . MASS EXCISION Right 12/14/2016   Procedure: EXCISION CYST RIGHT AXILLA;  Surgeon: Aviva Signs, MD;  Location: [facility];  Service: General;  Laterality: Right;    Social History: Social History   Socioeconomic History  .  Marital status: Married    Spouse name: Not on file  . Number of children: 2  . Years of education: Not on file  . Highest education level: Not on file  Occupational History  . Occupation: Marine scientist at [employer]  . Smoking status: Never Smoker  . Smokeless tobacco: Never Used  Vaping Use  . Vaping Use: Never used  Substance and Sexual Activity  . Alcohol use: No  . Drug use: No  . Sexual activity: Yes    Birth control/protection: None, Condom  Other Topics Concern  . Not on file  Social History Narrative  . Not on file   Social Determinants of Health   Financial Resource Strain: Not on file  Food Insecurity: Not on file   Transportation Needs: Not on file  Physical Activity: Not on file  Stress: Not on file  Social Connections: Not on file  Intimate Partner Violence: Not on file    Family History: Family History  Problem Relation Age of Onset  . Asthma Mother        most of her life   . Cancer Father        lung  . Cancer Paternal Aunt        cervical   . Hypertension Maternal Grandmother   . Ovarian cancer Other     Review of Systems: Review of Systems  Constitutional: Negative.   Respiratory: Negative.   Cardiovascular: Negative.   Gastrointestinal: Negative.   Musculoskeletal: Negative.   Neurological: Negative.     Physical Exam: Vital Signs BP (!) 144/84 (BP Location: Left Arm, Patient Position: Sitting, Cuff Size: Large)   Pulse 96   Ht 5\' 5"  (1.651 m)   Wt 234 lb (106.1 kg)   SpO2 98%   BMI 38.94 kg/m   Physical Exam  Constitutional:      General: Not in acute distress.    Appearance: Normal appearance. Not ill-appearing.  HENT:     Head: Normocephalic and atraumatic.  Eyes:     Pupils: Pupils are equal, round Neck:     Musculoskeletal: Normal range of motion.  Cardiovascular:     Rate and Rhythm: Normal rate and regular rhythm.     Pulses: Normal pulses.     Heart sounds: Normal heart sounds. No murmur.  Pulmonary:     Effort: Pulmonary effort is normal. No respiratory distress.     Breath sounds: Normal breath sounds. No wheezing.  Abdominal:     General: Abdomen is flat. There is no distension.     Palpations: Abdomen is soft.     Tenderness: There is no abdominal tenderness.  Musculoskeletal: Normal range of motion.  Skin:    General: Skin is warm and dry.     Findings: No erythema or rash.  Neurological:     General: No focal deficit present.     Mental Status: Alert and oriented to person, place, and time. Mental status is at baseline.     Motor: No weakness.  Psychiatric:        Mood and Affect: Mood normal.        Behavior: Behavior normal.     Assessment/Plan: The patient is scheduled for bilateral breast reduction with Dr. Claudia Desanctis.  Risks, benefits, and alternatives of procedure discussed, questions answered and consent obtained.    Smoking Status: Non-smoker; Counseling Given?  N/A Last Mammogram: 12/29/2020; Results: Negative  Caprini Score: 4, moderate; Risk Factors include: Age, BMI greater than 25, and length  of planned surgery. Recommendation for mechanical and pharmacological prophylaxis while hospitalized. Encourage early ambulation.   Pictures obtained:[email or web address]   Post-op Rx sent to pharmacy: Norco, Zofran  Patient was provided with the breast reduction and General Surgical Risk consent document and Pain Medication Agreement prior to their appointment.  They had adequate time to read through the risk consent documents and Pain Medication Agreement. We also discussed them in person together during this preop appointment. All of their questions were answered to their satisfaction.  Recommended calling if they have any further questions.  Risk consent form and Pain Medication Agreement to be scanned into patient's chart.  The risk that can be encountered with breast reduction were discussed and include the following but not limited to these:  Breast asymmetry, fluid accumulation, firmness of the breast, inability to breast feed, loss of nipple or areola, skin loss, decrease or no nipple sensation, fat necrosis of the breast tissue, bleeding, infection, healing delay.  There are risks of anesthesia, changes to skin sensation and injury to nerves or blood vessels.  The muscle can be temporarily or permanently injured.  You may have an allergic reaction to tape, suture, glue, blood products which can result in skin discoloration, swelling, pain, skin lesions, poor healing.  Any of these can lead to the need for revisonal surgery or stage procedures.  A reduction has potential to interfere with diagnostic procedures.  Nipple or breast  piercing can increase risks of infection.  This procedure is best done when the breast is fully developed.  Changes in the breast will continue to occur over time.  Pregnancy can alter the outcomes of previous breast reduction surgery, weight gain and weigh loss can also effect the long term appearance.     Electronically signed by: Carola Rhine Scheeler, PA-C 01/18/2021 3:30 PM

## 2021-01-18 NOTE — Progress Notes (Signed)
Virtual Visit via Telephone Note  I connected with Diamond Le on 01/18/21 at 10:00 AM EST by telephone and verified that I am speaking with the correct person using two identifiers.  Location: Patient: work Provider: office   I discussed the limitations, risks, security and privacy concerns of performing an evaluation and management service by telephone and the availability of in person appointments. I also discussed with the patient that there may be a patient responsible charge related to this service. The patient expressed understanding and agreed to proceed.   History of Present Illness: Abdominal pain and bloating , started last year and went to UC, saw Dr Posey Pronto earlier this month Increased and uncontrolled reflux this past weekend,going on for approx 1 year, tums helped , has never been on prescription med , does drink tea, needs to change todecaf, feels as though food is sticking at times most recently about 2 weeks ago, also has has had food and axcid in mouth when lying down about 2 weeks, experiences heartburn Still has not completed paperwork for and needs colonoscopy Good weight loss with intermittent fasting   Observations/Objective: Ht 5\' 5"  (1.651 m)   Wt 226 lb (102.5 kg)   BMI 37.61 kg/m  Good communication with no confusion and intact memory. Alert and oriented x 3 No signs of respiratory distress during speech    Assessment and Plan:  Abdominal bloating US gallbladder  GERD (gastroesophageal reflux disease) Check H pylori, trial of PPI and GI eval  Obesity (BMI 30-39.9)  Patient re-educated about  the importance of commitment to a  minimum of 150 minutes of exercise per week as able.  The importance of healthy food choices with portion control discussed, as well as eating regularly and within a 12 hour window most days. The need to choose "clean , green" food 50 to 75% of the time is discussed, as well as to make water the primary drink and set a goal  of 64 ounces water daily.    Weight /BMI 01/18/2021 01/18/2021 12/30/2020  WEIGHT 226 lb 234 lb 234 lb  HEIGHT 5\' 5"  5\' 5"  5\' 6"   BMI 37.61 kg/m2 38.94 kg/m2 37.77 kg/m2      Essential hypertension DASH diet and commitment to daily physical activity for a minimum of 30 minutes discussed and encouraged, as a part of hypertension management. The importance of attaining a healthy weight is also discussed.  BP/Weight 01/18/2021 01/18/2021 12/30/2020 11/09/2020 09/27/2020 04/05/2020 02/22/5187  Systolic BP - 416 606 301 601 093 235  Diastolic BP - 84 89 92 81 89 100  Wt. (Lbs) 226 234 234 235 240 - 237  BMI 37.61 38.94 37.77 37.93 38.74 - 38.25         Follow Up Instructions:    I discussed the assessment and treatment plan with the patient. The patient was provided an opportunity to ask questions and all were answered. The patient agreed with the plan and demonstrated an understanding of the instructions.   The patient was advised to call back or seek an in-person evaluation if the symptoms worsen or if the condition fails to improve as anticipated.  I provided 22 minutes of non-face-to-face time during this encounter.   Tula Nakayama, MD

## 2021-01-19 ENCOUNTER — Encounter (INDEPENDENT_AMBULATORY_CARE_PROVIDER_SITE_OTHER): Payer: Self-pay | Admitting: *Deleted

## 2021-01-19 DIAGNOSIS — R140 Abdominal distension (gaseous): Secondary | ICD-10-CM | POA: Insufficient documentation

## 2021-01-19 DIAGNOSIS — K219 Gastro-esophageal reflux disease without esophagitis: Secondary | ICD-10-CM | POA: Insufficient documentation

## 2021-01-19 LAB — LIPID PANEL
Chol/HDL Ratio: 3.6 ratio (ref 0.0–4.4)
Cholesterol, Total: 176 mg/dL (ref 100–199)
HDL: 49 mg/dL (ref >39)
LDL Chol Calc (NIH): 113 mg/dL — ABNORMAL HIGH (ref 0–99)
Triglycerides: 76 mg/dL (ref 0–149)
VLDL Cholesterol Cal: 14 mg/dL (ref 5–40)

## 2021-01-19 LAB — CMP14+EGFR
ALT: 15 IU/L (ref 0–32)
AST: 13 IU/L (ref 0–40)
Albumin/Globulin Ratio: 1.6 (ref 1.2–2.2)
Albumin: 4.5 g/dL (ref 3.8–4.9)
Alkaline Phosphatase: 90 IU/L (ref 44–121)
BUN/Creatinine Ratio: 14 (ref 9–23)
BUN: 13 mg/dL (ref 6–24)
Bilirubin Total: 0.3 mg/dL (ref 0.0–1.2)
CO2: 21 mmol/L (ref 20–29)
Calcium: 9.6 mg/dL (ref 8.7–10.2)
Chloride: 103 mmol/L (ref 96–106)
Creatinine, Ser: 0.94 mg/dL (ref 0.57–1.00)
GFR calc Af Amer: 81 mL/min/{1.73_m2} (ref >59)
GFR calc non Af Amer: 70 mL/min/{1.73_m2} (ref >59)
Globulin, Total: 2.8 g/dL (ref 1.5–4.5)
Glucose: 93 mg/dL (ref 65–99)
Potassium: 4.4 mmol/L (ref 3.5–5.2)
Sodium: 139 mmol/L (ref 134–144)
Total Protein: 7.3 g/dL (ref 6.0–8.5)

## 2021-01-19 LAB — CBC
Hematocrit: 35.8 % (ref 34.0–46.6)
Hemoglobin: 11.6 g/dL (ref 11.1–15.9)
MCH: 27.2 pg (ref 26.6–33.0)
MCHC: 32.4 g/dL (ref 31.5–35.7)
MCV: 84 fL (ref 79–97)
Platelets: 353 10*3/uL (ref 150–450)
RBC: 4.27 x10E6/uL (ref 3.77–5.28)
RDW: 12.2 % (ref 11.7–15.4)
WBC: 5.6 10*3/uL (ref 3.4–10.8)

## 2021-01-19 LAB — TSH: TSH: 2.23 u[IU]/mL (ref 0.450–4.500)

## 2021-01-19 LAB — VITAMIN D 25 HYDROXY (VIT D DEFICIENCY, FRACTURES): Vit D, 25-Hydroxy: 14.3 ng/mL — ABNORMAL LOW (ref 30.0–100.0)

## 2021-01-19 LAB — HEPATITIS C ANTIBODY: Hep C Virus Ab: <0.1 s/co ratio (ref 0.0–0.9)

## 2021-01-19 NOTE — Assessment & Plan Note (Signed)
DASH diet and commitment to daily physical activity for a minimum of 30 minutes discussed and encouraged, as a part of hypertension management. The importance of attaining a healthy weight is also discussed.  BP/Weight 01/18/2021 01/18/2021 12/30/2020 11/09/2020 09/27/2020 04/05/2020 1/56/1537  Systolic BP - 943 276 147 092 957 473  Diastolic BP - 84 89 92 81 89 100  Wt. (Lbs) 226 234 234 235 240 - 237  BMI 37.61 38.94 37.77 37.93 38.74 - 38.25

## 2021-01-19 NOTE — Assessment & Plan Note (Signed)
US gallbladder

## 2021-01-19 NOTE — Assessment & Plan Note (Signed)
  Patient re-educated about  the importance of commitment to a  minimum of 150 minutes of exercise per week as able.  The importance of healthy food choices with portion control discussed, as well as eating regularly and within a 12 hour window most days. The need to choose "clean , green" food 50 to 75% of the time is discussed, as well as to make water the primary drink and set a goal of 64 ounces water daily.    Weight /BMI 01/18/2021 01/18/2021 12/30/2020  WEIGHT 226 lb 234 lb 234 lb  HEIGHT 5\' 5"  5\' 5"  5\' 6"   BMI 37.61 kg/m2 38.94 kg/m2 37.77 kg/m2

## 2021-01-19 NOTE — Assessment & Plan Note (Signed)
Check H pylori, trial of PPI and GI eval

## 2021-01-20 ENCOUNTER — Other Ambulatory Visit: Payer: Self-pay | Admitting: Family Medicine

## 2021-01-20 MED ORDER — ERGOCALCIFEROL 1.25 MG (50000 UT) PO CAPS: 50000.0000 [IU] | ORAL_CAPSULE | ORAL | 2 refills | Status: DC

## 2021-01-22 ENCOUNTER — Emergency Department (HOSPITAL_COMMUNITY): Payer: Commercial Managed Care - PPO

## 2021-01-22 ENCOUNTER — Encounter (HOSPITAL_COMMUNITY): Payer: Self-pay | Admitting: Emergency Medicine

## 2021-01-22 ENCOUNTER — Other Ambulatory Visit: Payer: Self-pay

## 2021-01-22 ENCOUNTER — Emergency Department (HOSPITAL_COMMUNITY)
Admission: EM | Admit: 2021-01-22 | Discharge: 2021-01-22 | Disposition: A | Discharge status: Home / Self Care | Payer: Commercial Managed Care - PPO | Attending: Emergency Medicine | Admitting: Emergency Medicine

## 2021-01-22 DIAGNOSIS — I10 Essential (primary) hypertension: Secondary | ICD-10-CM | POA: Diagnosis not present

## 2021-01-22 DIAGNOSIS — M545 Low back pain, unspecified: Secondary | ICD-10-CM | POA: Diagnosis not present

## 2021-01-22 DIAGNOSIS — R1030 Lower abdominal pain, unspecified: Secondary | ICD-10-CM | POA: Insufficient documentation

## 2021-01-22 DIAGNOSIS — Z79899 Other long term (current) drug therapy: Secondary | ICD-10-CM | POA: Insufficient documentation

## 2021-01-22 DIAGNOSIS — K219 Gastro-esophageal reflux disease without esophagitis: Secondary | ICD-10-CM | POA: Diagnosis not present

## 2021-01-22 LAB — CBC WITH DIFFERENTIAL/PLATELET
Abs Immature Granulocytes: 0.01 10*3/uL (ref 0.00–0.07)
Basophils Absolute: 0 10*3/uL (ref 0.0–0.1)
Basophils Relative: 0 %
Eosinophils Absolute: 0.2 10*3/uL (ref 0.0–0.5)
Eosinophils Relative: 3 %
HCT: 36.9 % (ref 36.0–46.0)
Hemoglobin: 11.9 g/dL — ABNORMAL LOW (ref 12.0–15.0)
Immature Granulocytes: 0 %
Lymphocytes Relative: 22 %
Lymphs Abs: 1.5 10*3/uL (ref 0.7–4.0)
MCH: 27.5 pg (ref 26.0–34.0)
MCHC: 32.2 g/dL (ref 30.0–36.0)
MCV: 85.4 fL (ref 80.0–100.0)
Monocytes Absolute: 0.5 10*3/uL (ref 0.1–1.0)
Monocytes Relative: 8 %
Neutro Abs: 4.5 10*3/uL (ref 1.7–7.7)
Neutrophils Relative %: 67 %
Platelets: 333 10*3/uL (ref 150–400)
RBC: 4.32 MIL/uL (ref 3.87–5.11)
RDW: 12.8 % (ref 11.5–15.5)
WBC: 6.7 10*3/uL (ref 4.0–10.5)
nRBC: 0 % (ref 0.0–0.2)

## 2021-01-22 LAB — URINALYSIS, ROUTINE W REFLEX MICROSCOPIC
Bilirubin Urine: NEGATIVE
Glucose, UA: NEGATIVE mg/dL
Hgb urine dipstick: NEGATIVE
Ketones, ur: NEGATIVE mg/dL
Leukocytes,Ua: NEGATIVE
Nitrite: NEGATIVE
Protein, ur: NEGATIVE mg/dL
Specific Gravity, Urine: 1.039 — ABNORMAL HIGH (ref 1.005–1.030)
pH: 6 (ref 5.0–8.0)

## 2021-01-22 LAB — COMPREHENSIVE METABOLIC PANEL
ALT: 19 U/L (ref 0–44)
AST: 20 U/L (ref 15–41)
Albumin: 4.1 g/dL (ref 3.5–5.0)
Alkaline Phosphatase: 72 U/L (ref 38–126)
Anion gap: 10 (ref 5–15)
BUN: 12 mg/dL (ref 6–20)
CO2: 23 mmol/L (ref 22–32)
Calcium: 9 mg/dL (ref 8.9–10.3)
Chloride: 104 mmol/L (ref 98–111)
Creatinine, Ser: 0.89 mg/dL (ref 0.44–1.00)
GFR, Estimated: >60 mL/min (ref >60)
Glucose, Bld: 97 mg/dL (ref 70–99)
Potassium: 3.9 mmol/L (ref 3.5–5.1)
Sodium: 137 mmol/L (ref 135–145)
Total Bilirubin: 0.3 mg/dL (ref 0.3–1.2)
Total Protein: 7.4 g/dL (ref 6.5–8.1)

## 2021-01-22 LAB — HCG, QUANTITATIVE, PREGNANCY: hCG, Beta Chain, Quant, S: 1 m[IU]/mL (ref <5)

## 2021-01-22 MED ORDER — IOHEXOL 300 MG/ML  SOLN
100.0000 mL | Freq: Once | INTRAMUSCULAR | Status: AC | PRN
  Administered 2021-01-22: 100 mL via INTRAVENOUS

## 2021-01-22 NOTE — ED Triage Notes (Signed)
Patient c/o mid-lower back pain that started approx x1 month ago. Per patient abd distension/"bloating" with intermittent nausea. Denies any vomiting, diarrhea, fevers, or urinary symptoms. Per patient seen by Dr Moshe Cipro and has ultrasound scheduled but pain and distension are getting worse.

## 2021-01-22 NOTE — Discharge Instructions (Addendum)
Take Tylenol or Motrin for pain.  Follow-up at your OB/GYN office this week after you get your ultrasound

## 2021-01-22 NOTE — ED Provider Notes (Signed)
[name] EMERGENCY DEPARTMENT Provider Note   CSN: [id number] Arrival date & time: 01/22/21  0746     History Chief Complaint  Patient presents with  . Back Pain    Diamond Le is a 55 y.o. female.  Patient with lower abdominal pain and lower back pain.  No fever chills vomiting  The history is provided by the patient and medical records. No language interpreter was used.  Abdominal Pain Pain location:  Suprapubic Pain quality: aching   Pain radiates to:  Does not radiate Pain severity:  Moderate Onset quality:  Sudden Progression:  Worsening Chronicity:  New Context: not alcohol use   Relieved by:  Nothing Worsened by:  Nothing Ineffective treatments:  None tried Associated symptoms: no anorexia, no chest pain, no cough, no diarrhea, no fatigue and no hematuria        Past Medical History:  Diagnosis Date  . Folliculitis 02/16/5572  . Hypertension   . Obesity   . Palpitation   . Superficial fungus infection of skin 04/19/2015  . Vitamin D deficiency     Patient Active Problem List   Diagnosis Date Noted  . Abdominal bloating 01/19/2021  . GERD (gastroesophageal reflux disease) 01/19/2021  . Thoracic back pain 09/27/2020  . Macromastia 09/27/2020  . Obesity (BMI 30-39.9) 07/18/2019  . IDA (iron deficiency anemia) 07/18/2019  . Special screening for malignant neoplasms, colon 01/15/2019  . Vitamin D deficiency 03/21/2010  . Palpitations 03/21/2010  . Essential hypertension 04/11/2009    Past Surgical History:  Procedure Laterality Date  . BREAST CYST EXCISION Right 12/14/2016  . MASS EXCISION Right 12/14/2016   Procedure: EXCISION CYST RIGHT AXILLA;  Surgeon: Aviva Signs, MD;  Location: [facility];  Service: General;  Laterality: Right;     OB History    Gravida  2   Para  2   Term  2   Preterm      AB      Living  2     SAB      IAB      Ectopic      Multiple      Live Births  2           Family History  Problem  Relation Age of Onset  . Asthma Mother        most of her life   . Cancer Father        lung  . Cancer Paternal Aunt        cervical   . Hypertension Maternal Grandmother   . Ovarian cancer Other     Social History   Tobacco Use  . Smoking status: Never Smoker  . Smokeless tobacco: Never Used  Vaping Use  . Vaping Use: Never used  Substance Use Topics  . Alcohol use: No  . Drug use: No    Home Medications Prior to Admission medications   Medication Sig Start Date End Date Taking? Authorizing Provider  amLODipine (NORVASC) 10 MG tablet Take 1 tablet (10 mg total) by mouth daily. 05/23/20   Fayrene Helper, MD  cholecalciferol (VITAMIN D3) 25 MCG (1000 UNIT) tablet Take 1,000 Units by mouth daily.    [provider]  dicyclomine (BENTYL) 10 MG capsule Take 1 capsule (10 mg total) by mouth 3 (three) times daily as needed for spasms (Cramping). 12/30/20   Lindell Spar, MD  ergocalciferol (VITAMIN D2) 1.25 MG (50000 UT) capsule Take 1 capsule (50,000 Units total) by mouth once  a week. One capsule once weekly 01/20/21   Fayrene Helper, MD  Ferrous Sulfate (IRON) 325 (65 Fe) MG TABS Take one tablet by mouth once daily 09/27/20   Fayrene Helper, MD  HYDROcodone-acetaminophen (NORCO) 5-325 MG tablet Take 1 tablet by mouth every 6 (six) hours as needed for up to 5 days for severe pain. 01/18/21 01/23/21  Scheeler, Carola Rhine, PA-C  lisinopril (ZESTRIL) 5 MG tablet Take 1 tablet (5 mg total) by mouth daily. 02/24/20 05/24/20  Herminio Commons, MD  ondansetron (ZOFRAN) 4 MG tablet Take 1 tablet (4 mg total) by mouth every 8 (eight) hours as needed for nausea or vomiting. 01/18/21   Scheeler, Carola Rhine, PA-C  pantoprazole (PROTONIX) 40 MG tablet Take 1 tablet (40 mg total) by mouth daily. 01/18/21   Fayrene Helper, MD  Simethicone 80 MG TABS Take 1 tablet (80 mg total) by mouth 2 (two) times daily as needed (Bloating). 12/30/20   Lindell Spar, MD  spironolactone  (ALDACTONE) 50 MG tablet TAKE 1 TABLET(50 MG) BY MOUTH DAILY 07/26/20   Fayrene Helper, MD    Allergies    Patient has no known allergies.  Review of Systems   Review of Systems  Constitutional: Negative for appetite change and fatigue.  HENT: Negative for congestion, ear discharge and sinus pressure.   Eyes: Negative for discharge.  Respiratory: Negative for cough.   Cardiovascular: Negative for chest pain.  Gastrointestinal: Positive for abdominal pain. Negative for anorexia and diarrhea.  Genitourinary: Negative for frequency and hematuria.  Musculoskeletal: Positive for back pain.  Skin: Negative for rash.  Neurological: Negative for seizures and headaches.  Psychiatric/Behavioral: Negative for hallucinations.    Physical Exam Updated Vital Signs BP (!) 111/59 (BP Location: Left Arm)   Pulse 78   Temp 98 F (36.7 C) (Oral)   Resp 15   Ht 5\' 5"  (1.651 m)   Wt 102.5 kg   LMP 12/29/2020   SpO2 100%   BMI 37.61 kg/m   Physical Exam Vitals and nursing note reviewed.  Constitutional:      Appearance: She is well-developed.  HENT:     Head: Normocephalic.     Nose: Nose normal.  Eyes:     General: No scleral icterus.    Extraocular Movements: EOM normal.     Conjunctiva/sclera: Conjunctivae normal.  Neck:     Thyroid: No thyromegaly.  Cardiovascular:     Rate and Rhythm: Normal rate and regular rhythm.     Heart sounds: No murmur heard. No friction rub. No gallop.   Pulmonary:     Breath sounds: No stridor. No wheezing or rales.  Chest:     Chest wall: No tenderness.  Abdominal:     General: There is no distension.     Tenderness: There is abdominal tenderness. There is no rebound.  Musculoskeletal:        General: No edema. Normal range of motion.     Cervical back: Neck supple.  Lymphadenopathy:     Cervical: No cervical adenopathy.  Skin:    Findings: No erythema or rash.  Neurological:     Mental Status: She is alert and oriented to person,  place, and time.     Motor: No abnormal muscle tone.     Coordination: Coordination normal.  Psychiatric:        Mood and Affect: Mood and affect normal.        Behavior: Behavior normal.     ED  Results / Procedures / Treatments   Labs (all labs ordered are listed, but only abnormal results are displayed) Labs Reviewed  CBC WITH DIFFERENTIAL/PLATELET - Abnormal; Notable for the following components:      Result Value   Hemoglobin 11.9 (*)    All other components within normal limits  URINALYSIS, ROUTINE W REFLEX MICROSCOPIC - Abnormal; Notable for the following components:   Color, Urine STRAW (*)    Specific Gravity, Urine 1.039 (*)    All other components within normal limits  COMPREHENSIVE METABOLIC PANEL  HCG, QUANTITATIVE, PREGNANCY    EKG None  Radiology CT ABDOMEN PELVIS W CONTRAST  Result Date: 01/22/2021 CLINICAL DATA:  Lower back pain and abdominal distention with nausea and cramping x2 weeks. EXAM: CT ABDOMEN AND PELVIS WITH CONTRAST TECHNIQUE: Multidetector CT imaging of the abdomen and pelvis was performed using the standard protocol following bolus administration of intravenous contrast. CONTRAST:  138mL OMNIPAQUE IOHEXOL 300 MG/ML  SOLN COMPARISON:  CT of pelvis October 21, 2007. FINDINGS: Lower chest: No acute abnormality. Normal size heart. No pericardial effusion. No pleural effusion. Hepatobiliary: No suspicious hepatic lesion. Gallbladder is unremarkable. No biliary ductal dilation. Pancreas: Unremarkable Spleen: Unremarkable Adrenals/Urinary Tract: Bilateral adrenal glands are unremarkable. No hydronephrosis. No suspicious renal masses. No filling defect visualized within the opacified portions of the collecting system and ureters on delayed imaging. Urinary bladder is unremarkable for degree of distension. Stomach/Bowel: Stomach is predominantly decompressed otherwise unremarkable. Normal positioning of the duodenum/ligament of Treitz. Appendix and terminal ileum  appear grossly unremarkable. No suspicious colonic wall thickening or masslike lesion. Vascular/Lymphatic: No significant vascular findings are present. No enlarged abdominal or pelvic lymph nodes. Reproductive: Polypoid masslike thickening of the endometrial canal at uterine fundus with fluid in the endometrial canal seen on image 68/2 and 77/6. Left adnexal cyst measuring 3.1 cm. Right ovary/adnexa is unremarkable. Other: No omental or mesenteric nodularity visualized. No abdominopelvic ascites. 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 No acute osseous abnormality. IMPRESSION: 1. Polypoid masslike thickening of the endometrial canal at uterine fundus with fluid in the endometrial canal, findings which are nonspecific within a differential diagnosis including but not limited to uterine fibroid, endometrial polyp or endometrial cancer. Gynecologic consult and further evaluation with pelvic ultrasound is recommended. 2. Left adnexal cyst measuring 3.1 cm. Because this lesion is not adequately characterized, prompt [place] is recommended for further evaluation. Note: This recommendation does not apply to premenarchal patients and to those with increased risk (genetic, family history, elevated tumor markers or other high-risk factors) of ovarian cancer. Reference: JACR 2020 Feb; 17(2):248-254 3. 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 Electronically Signed   By: Dahlia Bailiff MD   On: 01/22/2021 09:04    Procedures Procedures   Medications Ordered in ED Medications  iohexol (OMNIPAQUE) 300 MG/ML solution 100 mL (100 mLs Intravenous Contrast Given 01/22/21 0851)    ED Course  I have reviewed the triage vital signs and the nursing notes.  Pertinent labs & imaging results that were available during my care of the patient were reviewed by me and considered in my medical decision making (see chart for details).    MDM  Rules/Calculators/A&P                          Patient with possible uterine fibroids.  She will get an ultrasound this week and follow-up with her OB/GYN Final  Clinical Impression(s) / ED Diagnoses Final diagnoses:  None    Rx / DC Orders ED Discharge Orders         Ordered    US PELVIC COMPLETE WITH TRANSVAGINAL        01/22/21 1342           Milton Ferguson, MD 01/26/21 [phone]

## 2021-01-23 ENCOUNTER — Other Ambulatory Visit: Payer: Self-pay

## 2021-01-23 ENCOUNTER — Telehealth: Payer: Self-pay

## 2021-01-23 ENCOUNTER — Ambulatory Visit (HOSPITAL_COMMUNITY)
Admission: RE | Admit: 2021-01-23 | Discharge: 2021-01-23 | Disposition: A | Discharge status: Home / Self Care | Payer: Commercial Managed Care - PPO | Source: Ambulatory Visit | Attending: Internal Medicine | Admitting: Internal Medicine

## 2021-01-23 DIAGNOSIS — R109 Unspecified abdominal pain: Secondary | ICD-10-CM

## 2021-01-23 DIAGNOSIS — D219 Benign neoplasm of connective and other soft tissue, unspecified: Secondary | ICD-10-CM

## 2021-01-23 DIAGNOSIS — R140 Abdominal distension (gaseous): Secondary | ICD-10-CM

## 2021-01-23 NOTE — Telephone Encounter (Signed)
Yes, please order it. Thanks.

## 2021-01-23 NOTE — Telephone Encounter (Signed)
This has been taken care of. Pt aware of [place].

## 2021-01-23 NOTE — Telephone Encounter (Signed)
Patient went to the ER yesterday and had a CT and they told her that our office needed to order a STAT ultrasound and have stat gyne follow up.  IMPRESSION:  1. Polypoid masslike thickening of the endometrial canal at uterine  fundus with fluid in the endometrial canal, findings which are  nonspecific within a differential diagnosis including but not  limited to uterine fibroid, endometrial polyp or endometrial cancer.  Gynecologic consult and further evaluation with pelvic ultrasound is  recommended.  2. Left adnexal cyst measuring 3.1 cm. Because this lesion is not  adequately characterized, prompt [place] is recommended for further  evaluation. Note: This recommendation does not apply to premenarchal  patients and to those with increased risk (genetic, family history,  elevated tumor markers or other high-risk factors) of ovarian  cancer. Reference: JACR 2020 Feb; 17(2):248-254

## 2021-01-23 NOTE — Telephone Encounter (Signed)
Pt called stating she has seen you for abdominal pain before and she has also seen Dr. Moshe Cipro for the same thing. She ended up going to the ER Saturday because the pain was so bad. They told her she had fibroids and that she needed a STAT U/S of her pelvic complete w/ transvaginal and that her PCP would have to order this. Can we order this for her? Please advise.

## 2021-01-23 NOTE — Telephone Encounter (Signed)
Received the message earlier. [place] ordered and scheduled.

## 2021-01-25 ENCOUNTER — Telehealth: Payer: Self-pay | Admitting: Surgical

## 2021-01-25 ENCOUNTER — Telehealth: Payer: Self-pay

## 2021-01-25 NOTE — Telephone Encounter (Signed)
Call returned to pt regarding the following: Pt is experiencing pain lower back & abdomen area with abdominal " bloating/fullness" She has normal bowel/bladder function.  She went to the ED on 01/22/21 with this complaint & she was scheduled for pelvic ultrasound & CT scan- which was performed on 01/23/21. Per the pt she was diagnosed with " uterine fibroids"- & referred to her OB/GYN for further treatment/plan. Patient is using a "lumbar/back support brace" & has difficulty with standing or walking for more than 10-15 minutes d/t pain. She was scheduled for an office visit at her GYN- Dr. Glo Herring for tomorrow 01/26/21- but she was later told that Dr. Glo Herring was not available tomorrow- & she was informed that he needs to see her v/s any other provider there.  She has an appointment with him on 02/07/21 however, they are trying to see her as soon as possible. The patient is scheduled for surgery with Dr. Claudia Desanctis on 01/31/21 for bilateral breast reduction-but she does not want to go forward with this surgery until her GYN evaluates her condition & provides plan of treatment for the back pain. I consulted with Dr. Claudia Desanctis & he agrees that she should follow Dr. Johnnye Sima recommendations for further plan of care. She will have Dr. Glo Herring forward records to our office for review & we will reschedule her breast procedure when appropriate. I informed Frances Furbish of the cancellation & she will forward this to surgery center/pre-admission.

## 2021-01-25 NOTE — Telephone Encounter (Signed)
Patient called to say that she has been having lower back pain over the weekend and her abdomen is swollen from fibroids. She said she can only stand for a few hours at a time. She will not see her OBGYN until 03/15 and wanted to know if she needs to reschedule her surgery (3/8). Please call her to advise if she needs to change the surgery date or if she can still proceed. She requested that a message be left if she does not answer as she does not get signal in certain parts of the building she is in.

## 2021-01-26 ENCOUNTER — Ambulatory Visit: Payer: Commercial Managed Care - PPO | Admitting: Women's Health

## 2021-01-27 ENCOUNTER — Ambulatory Visit (HOSPITAL_COMMUNITY): Payer: Commercial Managed Care - PPO

## 2021-01-28 ENCOUNTER — Other Ambulatory Visit (HOSPITAL_COMMUNITY): Payer: Commercial Managed Care - PPO

## 2021-01-31 ENCOUNTER — Ambulatory Visit: Payer: Commercial Managed Care - PPO | Admitting: Family Medicine

## 2021-01-31 ENCOUNTER — Encounter (HOSPITAL_BASED_OUTPATIENT_CLINIC_OR_DEPARTMENT_OTHER): Payer: Self-pay

## 2021-01-31 ENCOUNTER — Other Ambulatory Visit: Payer: Self-pay | Admitting: Student

## 2021-01-31 ENCOUNTER — Ambulatory Visit (HOSPITAL_BASED_OUTPATIENT_CLINIC_OR_DEPARTMENT_OTHER): Admit: 2021-01-31 | Payer: Commercial Managed Care - PPO | Admitting: Plastic Surgery

## 2021-01-31 SURGERY — MAMMOPLASTY, REDUCTION
Anesthesia: General | Site: Breast | Laterality: Bilateral

## 2021-01-31 MED ORDER — LISINOPRIL 5 MG PO TABS: 5.0000 mg | ORAL_TABLET | Freq: Every day | ORAL | 0 refills | Status: DC

## 2021-02-02 ENCOUNTER — Telehealth: Payer: Commercial Managed Care - PPO | Admitting: Family Medicine

## 2021-02-06 ENCOUNTER — Other Ambulatory Visit: Payer: Self-pay

## 2021-02-06 ENCOUNTER — Encounter: Payer: Self-pay | Admitting: Adult Health

## 2021-02-06 ENCOUNTER — Ambulatory Visit (INDEPENDENT_AMBULATORY_CARE_PROVIDER_SITE_OTHER): Payer: Commercial Managed Care - PPO | Admitting: Adult Health

## 2021-02-06 VITALS — BP 111/65 | HR 81 | Ht 65.0 in | Wt 233.5 lb

## 2021-02-06 DIAGNOSIS — Z1211 Encounter for screening for malignant neoplasm of colon: Secondary | ICD-10-CM | POA: Diagnosis not present

## 2021-02-06 DIAGNOSIS — N946 Dysmenorrhea, unspecified: Secondary | ICD-10-CM | POA: Diagnosis not present

## 2021-02-06 DIAGNOSIS — N920 Excessive and frequent menstruation with regular cycle: Secondary | ICD-10-CM | POA: Diagnosis not present

## 2021-02-06 DIAGNOSIS — Z01419 Encounter for gynecological examination (general) (routine) without abnormal findings: Secondary | ICD-10-CM | POA: Diagnosis not present

## 2021-02-06 DIAGNOSIS — R140 Abdominal distension (gaseous): Secondary | ICD-10-CM

## 2021-02-06 DIAGNOSIS — N838 Other noninflammatory disorders of ovary, fallopian tube and broad ligament: Secondary | ICD-10-CM

## 2021-02-06 DIAGNOSIS — D219 Benign neoplasm of connective and other soft tissue, unspecified: Secondary | ICD-10-CM

## 2021-02-06 DIAGNOSIS — Z8041 Family history of malignant neoplasm of ovary: Secondary | ICD-10-CM

## 2021-02-06 LAB — HEMOCCULT GUIAC POC 1CARD (OFFICE): Fecal Occult Blood, POC: NEGATIVE

## 2021-02-06 NOTE — Progress Notes (Signed)
Patient ID: Diamond Le, female   DOB: [birth year], 55 y.o.   MRN: [id number] History of Present Illness:  Diamond Le is a 55 year old black female, married, G2P2, in for well woman gyn exam and wants to discuss [place] and CT from recent ER visit. Last pap was 12/10/2018, normal with negative HPV PCP Dr Moshe Cipro.  Current Medications, Allergies, Past Medical History, Past Surgical History, Family History and Social History were reviewed in [employer] record.     Review of Systems: Patient denies any headaches, hearing loss, fatigue, blurred vision, shortness of breath, chest pain, abdominal pain, problems with bowel movements, urination, or intercourse. No joint pain or mood swings. Periods regular and heavy 4/7 days. +cramps +bloating on and off for about a year Had back pain and went to ER    Physical Exam:BP 111/65 (BP Location: Left Arm, Patient Position: Sitting, Cuff Size: Large)   Pulse 81   Ht 5\' 5"  (1.651 m)   Wt 233 lb 8 oz (105.9 kg)   LMP 02/03/2021   BMI 38.86 kg/m  General:  Well developed, well nourished, no acute distress Skin:  Warm and dry Neck:  Midline trachea, normal thyroid, good ROM, no lymphadenopathy Lungs; Clear to auscultation bilaterally Breast:  No dominant palpable mass, retraction, or nipple discharge Cardiovascular: Regular rate and rhythm Abdomen:  Soft, non tender, no hepatosplenomegaly Pelvic:  External genitalia is normal in appearance, no lesions.  The vagina is normal in appearance. Urethra has no lesions or masses. The cervix is bulbous.  Uterus is felt to be normal size, shape, and contour.  No adnexal masses or tenderness noted.Bladder is non tender, no masses felt. Rectal: Good sphincter tone, no polyps, or hemorrhoids felt.  Hemoccult negative. Extremities/musculoskeletal:  No swelling or varicosities noted, no clubbing or cyanosis Psych:  No mood changes, alert and cooperative,seems happy US showed uterus with multiple  fibroids and 2.6 cm mass left ovary, dominant follicle AA is 2 Fall risk is low PHQ 9 score is 0 GAD 7 score is 0  Upstream - 02/06/21 1351      Pregnancy Intention Screening   Does the patient want to become pregnant in the next year? No    Does the patient's partner want to become pregnant in the next year? No    Would the patient like to discuss contraceptive options today? No      Contraception Wrap Up   Current Method Female Condom    End Method Female Condom    Contraception Counseling Provided No         Examination chaperoned by Levy Pupa LPN   Impression and Plan: 1. Encounter for well woman exam with routine gynecological exam Physical in 1 year Pap in 1 year Mammogram yearly  2. Encounter for screening fecal occult blood testing  3. Menorrhagia with regular cycle Will talk when CA125 back, if normal, may try megace  4. Dysmenorrhea  5. Bloating  6. Ovarian mass, left Will check CA 125, will talk when results back   7. Fibroids  8. Family history of ovarian cancer Check CA 125

## 2021-02-07 ENCOUNTER — Ambulatory Visit: Payer: Commercial Managed Care - PPO | Admitting: Obstetrics & Gynecology

## 2021-02-07 LAB — CA 125: Cancer Antigen (CA) 125: 12.1 U/mL (ref 0.0–38.1)

## 2021-02-08 ENCOUNTER — Telehealth: Payer: Self-pay | Admitting: Adult Health

## 2021-02-08 ENCOUNTER — Encounter: Payer: Commercial Managed Care - PPO | Admitting: Plastic Surgery

## 2021-02-08 NOTE — Telephone Encounter (Signed)
Pt aware that CA 125 normal, she declines meds for period for now

## 2021-02-15 ENCOUNTER — Encounter (HOSPITAL_BASED_OUTPATIENT_CLINIC_OR_DEPARTMENT_OTHER): Payer: Self-pay | Admitting: Plastic Surgery

## 2021-02-15 ENCOUNTER — Other Ambulatory Visit: Payer: Self-pay

## 2021-02-17 ENCOUNTER — Encounter (HOSPITAL_BASED_OUTPATIENT_CLINIC_OR_DEPARTMENT_OTHER)
Admission: RE | Admit: 2021-02-17 | Discharge: 2021-02-17 | Disposition: A | Discharge status: Home / Self Care | Payer: Commercial Managed Care - PPO | Source: Ambulatory Visit | Attending: Plastic Surgery | Admitting: Plastic Surgery

## 2021-02-17 ENCOUNTER — Other Ambulatory Visit (HOSPITAL_COMMUNITY)
Admission: RE | Admit: 2021-02-17 | Discharge: 2021-02-17 | Disposition: A | Discharge status: Home / Self Care | Payer: Commercial Managed Care - PPO | Source: Ambulatory Visit | Attending: Plastic Surgery | Admitting: Plastic Surgery

## 2021-02-17 DIAGNOSIS — Z20822 Contact with and (suspected) exposure to covid-19: Secondary | ICD-10-CM | POA: Insufficient documentation

## 2021-02-17 DIAGNOSIS — Z01812 Encounter for preprocedural laboratory examination: Secondary | ICD-10-CM | POA: Insufficient documentation

## 2021-02-17 DIAGNOSIS — I10 Essential (primary) hypertension: Secondary | ICD-10-CM | POA: Diagnosis not present

## 2021-02-17 DIAGNOSIS — Z01810 Encounter for preprocedural cardiovascular examination: Secondary | ICD-10-CM | POA: Insufficient documentation

## 2021-02-17 LAB — BASIC METABOLIC PANEL
Anion gap: 5 (ref 5–15)
BUN: 11 mg/dL (ref 6–20)
CO2: 27 mmol/L (ref 22–32)
Calcium: 9.2 mg/dL (ref 8.9–10.3)
Chloride: 106 mmol/L (ref 98–111)
Creatinine, Ser: 0.97 mg/dL (ref 0.44–1.00)
GFR, Estimated: >60 mL/min (ref >60)
Glucose, Bld: 92 mg/dL (ref 70–99)
Potassium: 4 mmol/L (ref 3.5–5.1)
Sodium: 138 mmol/L (ref 135–145)

## 2021-02-17 LAB — SARS CORONAVIRUS 2 (TAT 6-24 HRS): SARS Coronavirus 2: NEGATIVE

## 2021-02-17 LAB — POCT PREGNANCY, URINE: Preg Test, Ur: NEGATIVE

## 2021-02-18 ENCOUNTER — Inpatient Hospital Stay (HOSPITAL_COMMUNITY): Admission: RE | Admit: 2021-02-18 | Payer: Commercial Managed Care - PPO | Source: Ambulatory Visit

## 2021-02-21 ENCOUNTER — Encounter (HOSPITAL_BASED_OUTPATIENT_CLINIC_OR_DEPARTMENT_OTHER)
Admission: RE | Disposition: A | Discharge status: Home / Self Care | Payer: Self-pay | Source: Home / Self Care | Attending: Plastic Surgery

## 2021-02-21 ENCOUNTER — Ambulatory Visit (HOSPITAL_BASED_OUTPATIENT_CLINIC_OR_DEPARTMENT_OTHER)
Admission: RE | Admit: 2021-02-21 | Discharge: 2021-02-21 | Disposition: A | Discharge status: Home / Self Care | Payer: Commercial Managed Care - PPO | Attending: Plastic Surgery | Admitting: Plastic Surgery

## 2021-02-21 ENCOUNTER — Ambulatory Visit (HOSPITAL_BASED_OUTPATIENT_CLINIC_OR_DEPARTMENT_OTHER): Payer: Commercial Managed Care - PPO | Admitting: Anesthesiology

## 2021-02-21 ENCOUNTER — Other Ambulatory Visit: Payer: Self-pay

## 2021-02-21 ENCOUNTER — Encounter (HOSPITAL_BASED_OUTPATIENT_CLINIC_OR_DEPARTMENT_OTHER): Payer: Self-pay | Admitting: Plastic Surgery

## 2021-02-21 DIAGNOSIS — Z825 Family history of asthma and other chronic lower respiratory diseases: Secondary | ICD-10-CM | POA: Insufficient documentation

## 2021-02-21 DIAGNOSIS — Z8249 Family history of ischemic heart disease and other diseases of the circulatory system: Secondary | ICD-10-CM | POA: Diagnosis not present

## 2021-02-21 DIAGNOSIS — Z8049 Family history of malignant neoplasm of other genital organs: Secondary | ICD-10-CM | POA: Insufficient documentation

## 2021-02-21 DIAGNOSIS — M546 Pain in thoracic spine: Secondary | ICD-10-CM | POA: Diagnosis not present

## 2021-02-21 DIAGNOSIS — Z801 Family history of malignant neoplasm of trachea, bronchus and lung: Secondary | ICD-10-CM | POA: Insufficient documentation

## 2021-02-21 DIAGNOSIS — I10 Essential (primary) hypertension: Secondary | ICD-10-CM | POA: Insufficient documentation

## 2021-02-21 DIAGNOSIS — Z8041 Family history of malignant neoplasm of ovary: Secondary | ICD-10-CM | POA: Diagnosis not present

## 2021-02-21 DIAGNOSIS — N62 Hypertrophy of breast: Secondary | ICD-10-CM

## 2021-02-21 DIAGNOSIS — Z79899 Other long term (current) drug therapy: Secondary | ICD-10-CM | POA: Diagnosis not present

## 2021-02-21 HISTORY — PX: BREAST REDUCTION SURGERY: SHX8

## 2021-02-21 HISTORY — DX: Anemia, unspecified: D64.9

## 2021-02-21 HISTORY — DX: Gastro-esophageal reflux disease without esophagitis: K21.9

## 2021-02-21 HISTORY — DX: Hypertrophy of breast: N62

## 2021-02-21 LAB — POCT PREGNANCY, URINE: Preg Test, Ur: NEGATIVE

## 2021-02-21 SURGERY — MAMMOPLASTY, REDUCTION
Anesthesia: General | Site: Breast | Laterality: Bilateral

## 2021-02-21 MED ORDER — ONDANSETRON HCL 4 MG/2ML IJ SOLN
INTRAMUSCULAR | Status: AC
  Filled 2021-02-21: qty 2

## 2021-02-21 MED ORDER — MIDAZOLAM HCL 2 MG/2ML IJ SOLN
INTRAMUSCULAR | Status: AC
  Filled 2021-02-21: qty 2

## 2021-02-21 MED ORDER — PROPOFOL 10 MG/ML IV BOLUS
INTRAVENOUS | Status: AC
  Filled 2021-02-21: qty 20

## 2021-02-21 MED ORDER — SCOPOLAMINE 1 MG/3DAYS TD PT72
MEDICATED_PATCH | TRANSDERMAL | Status: AC
  Filled 2021-02-21: qty 1

## 2021-02-21 MED ORDER — PROPOFOL 500 MG/50ML IV EMUL
INTRAVENOUS | Status: DC | PRN
  Administered 2021-02-21: 25 ug/kg/min via INTRAVENOUS

## 2021-02-21 MED ORDER — CEFAZOLIN SODIUM-DEXTROSE 2-4 GM/100ML-% IV SOLN
INTRAVENOUS | Status: AC
  Filled 2021-02-21: qty 100

## 2021-02-21 MED ORDER — LACTATED RINGERS IV SOLN: INTRAVENOUS | Status: DC

## 2021-02-21 MED ORDER — CEFAZOLIN SODIUM-DEXTROSE 2-4 GM/100ML-% IV SOLN
2.0000 g | INTRAVENOUS | Status: AC
  Administered 2021-02-21: 2 g via INTRAVENOUS

## 2021-02-21 MED ORDER — ONDANSETRON HCL 4 MG/2ML IJ SOLN
INTRAMUSCULAR | Status: DC | PRN
  Administered 2021-02-21: 4 mg via INTRAVENOUS

## 2021-02-21 MED ORDER — DEXAMETHASONE SODIUM PHOSPHATE 4 MG/ML IJ SOLN
INTRAMUSCULAR | Status: DC | PRN
  Administered 2021-02-21: 5 mg via INTRAVENOUS

## 2021-02-21 MED ORDER — OXYCODONE HCL 5 MG/5ML PO SOLN: 5.0000 mg | Freq: Once | ORAL | Status: AC | PRN

## 2021-02-21 MED ORDER — MIDAZOLAM HCL 5 MG/5ML IJ SOLN
INTRAMUSCULAR | Status: DC | PRN
  Administered 2021-02-21: 2 mg via INTRAVENOUS

## 2021-02-21 MED ORDER — PROPOFOL 500 MG/50ML IV EMUL
INTRAVENOUS | Status: AC
  Filled 2021-02-21: qty 50

## 2021-02-21 MED ORDER — ROCURONIUM BROMIDE 10 MG/ML (PF) SYRINGE
PREFILLED_SYRINGE | INTRAVENOUS | Status: AC
  Filled 2021-02-21: qty 10

## 2021-02-21 MED ORDER — ROCURONIUM BROMIDE 100 MG/10ML IV SOLN
INTRAVENOUS | Status: DC | PRN
  Administered 2021-02-21: 60 mg via INTRAVENOUS

## 2021-02-21 MED ORDER — PROMETHAZINE HCL 25 MG/ML IJ SOLN: 6.2500 mg | INTRAMUSCULAR | Status: DC | PRN

## 2021-02-21 MED ORDER — OXYCODONE HCL 5 MG PO TABS
5.0000 mg | ORAL_TABLET | Freq: Once | ORAL | Status: AC | PRN
Start: 2021-02-21 — End: 2021-02-21
  Administered 2021-02-21: 5 mg via ORAL

## 2021-02-21 MED ORDER — PROPOFOL 10 MG/ML IV BOLUS
INTRAVENOUS | Status: DC | PRN
  Administered 2021-02-21: 180 mg via INTRAVENOUS

## 2021-02-21 MED ORDER — OXYCODONE HCL 5 MG PO TABS
ORAL_TABLET | ORAL | Status: AC
  Filled 2021-02-21: qty 1

## 2021-02-21 MED ORDER — FENTANYL CITRATE (PF) 100 MCG/2ML IJ SOLN
INTRAMUSCULAR | Status: AC
  Filled 2021-02-21: qty 2

## 2021-02-21 MED ORDER — FENTANYL CITRATE (PF) 100 MCG/2ML IJ SOLN
25.0000 ug | INTRAMUSCULAR | Status: DC | PRN
Start: 2021-02-21 — End: 2021-02-21
  Administered 2021-02-21 (×2): 25 ug via INTRAVENOUS

## 2021-02-21 MED ORDER — LIDOCAINE 2% (20 MG/ML) 5 ML SYRINGE
INTRAMUSCULAR | Status: AC
  Filled 2021-02-21: qty 5

## 2021-02-21 MED ORDER — LIDOCAINE HCL (CARDIAC) PF 100 MG/5ML IV SOSY
PREFILLED_SYRINGE | INTRAVENOUS | Status: DC | PRN
  Administered 2021-02-21: 50 mg via INTRAVENOUS

## 2021-02-21 MED ORDER — ACETAMINOPHEN 500 MG PO TABS
ORAL_TABLET | ORAL | Status: AC
  Filled 2021-02-21: qty 2

## 2021-02-21 MED ORDER — FENTANYL CITRATE (PF) 100 MCG/2ML IJ SOLN
INTRAMUSCULAR | Status: DC | PRN
  Administered 2021-02-21: 100 ug via INTRAVENOUS

## 2021-02-21 MED ORDER — DEXAMETHASONE SODIUM PHOSPHATE 10 MG/ML IJ SOLN
INTRAMUSCULAR | Status: AC
  Filled 2021-02-21: qty 1

## 2021-02-21 MED ORDER — 0.9 % SODIUM CHLORIDE (POUR BTL) OPTIME
TOPICAL | Status: DC | PRN
  Administered 2021-02-21: 1000 mL

## 2021-02-21 MED ORDER — ACETAMINOPHEN 500 MG PO TABS
1000.0000 mg | ORAL_TABLET | Freq: Once | ORAL | Status: AC
  Administered 2021-02-21: 1000 mg via ORAL

## 2021-02-21 MED ORDER — SUGAMMADEX SODIUM 200 MG/2ML IV SOLN
INTRAVENOUS | Status: DC | PRN
  Administered 2021-02-21: 225 mg via INTRAVENOUS

## 2021-02-21 MED ORDER — SCOPOLAMINE 1 MG/3DAYS TD PT72
1.0000 | MEDICATED_PATCH | Freq: Once | TRANSDERMAL | Status: DC
  Administered 2021-02-21: 1.5 mg via TRANSDERMAL

## 2021-02-21 MED ORDER — LACTATED RINGERS IV SOLN: INTRAVENOUS | Status: DC | PRN

## 2021-02-21 SURGICAL SUPPLY — 71 items
APL PRP STRL LF DISP 70% ISPRP (MISCELLANEOUS) ×2
APL SKNCLS STERI-STRIP NONHPOA (GAUZE/BANDAGES/DRESSINGS) ×2
BAG DECANTER FOR FLEXI CONT (MISCELLANEOUS) IMPLANT
BENZOIN TINCTURE PRP APPL 2/3 (GAUZE/BANDAGES/DRESSINGS) ×4 IMPLANT
BLADE SURG 10 STRL SS (BLADE) ×4 IMPLANT
BLADE SURG 15 STRL LF DISP TIS (BLADE) IMPLANT
BLADE SURG 15 STRL SS (BLADE)
BNDG ELASTIC 6X5.8 VLCR STR LF (GAUZE/BANDAGES/DRESSINGS) ×4 IMPLANT
CANISTER SUCT 1200ML W/VALVE (MISCELLANEOUS) ×2 IMPLANT
CHLORAPREP W/TINT 26 (MISCELLANEOUS) ×4 IMPLANT
CLIP VESOCCLUDE MED 6/CT (CLIP) IMPLANT
COVER BACK TABLE 60X90IN (DRAPES) ×2 IMPLANT
COVER MAYO STAND STRL (DRAPES) ×2 IMPLANT
COVER WAND RF STERILE (DRAPES) IMPLANT
DECANTER SPIKE VIAL GLASS SM (MISCELLANEOUS) IMPLANT
DRAIN CHANNEL 15F RND FF W/TCR (WOUND CARE) IMPLANT
DRAIN PENROSE 1/2X12 LTX STRL (WOUND CARE) IMPLANT
DRAPE LAPAROSCOPIC ABDOMINAL (DRAPES) ×2 IMPLANT
DRAPE UTILITY XL STRL (DRAPES) ×2 IMPLANT
DRSG PAD ABDOMINAL 8X10 ST (GAUZE/BANDAGES/DRESSINGS) ×6 IMPLANT
ELECT REM PT RETURN 9FT ADLT (ELECTROSURGICAL) ×2
ELECTRODE REM PT RTRN 9FT ADLT (ELECTROSURGICAL) ×1 IMPLANT
EVACUATOR SILICONE 100CC (DRAIN) IMPLANT
GAUZE SPONGE 4X4 12PLY STRL (GAUZE/BANDAGES/DRESSINGS) ×4 IMPLANT
GAUZE XEROFORM 5X9 LF (GAUZE/BANDAGES/DRESSINGS) IMPLANT
GLOVE SRG 8 PF TXTR STRL LF DI (GLOVE) IMPLANT
GLOVE SURG ENC MOIS LTX SZ6.5 (GLOVE) ×1 IMPLANT
GLOVE SURG ENC MOIS LTX SZ7.5 (GLOVE) IMPLANT
GLOVE SURG ENC TEXT LTX SZ7.5 (GLOVE) ×2 IMPLANT
GLOVE SURG LTX SZ6.5 (GLOVE) IMPLANT
GLOVE SURG UNDER POLY LF SZ8 (GLOVE) ×2
GOWN STRL REUS W/ TWL LRG LVL3 (GOWN DISPOSABLE) ×3 IMPLANT
GOWN STRL REUS W/TWL LRG LVL3 (GOWN DISPOSABLE) ×6
MARKER SKIN DUAL TIP RULER LAB (MISCELLANEOUS) IMPLANT
NDL FILTER BLUNT 18X1 1/2 (NEEDLE) ×1 IMPLANT
NDL HYPO 25X1 1.5 SAFETY (NEEDLE) IMPLANT
NDL SAFETY ECLIPSE 18X1.5 (NEEDLE) ×1 IMPLANT
NDL SPNL 18GX3.5 QUINCKE PK (NEEDLE) ×1 IMPLANT
NEEDLE FILTER BLUNT 18X 1/2SAF (NEEDLE) ×1
NEEDLE FILTER BLUNT 18X1 1/2 (NEEDLE) ×1 IMPLANT
NEEDLE HYPO 18GX1.5 SHARP (NEEDLE)
NEEDLE HYPO 25X1 1.5 SAFETY (NEEDLE) IMPLANT
NEEDLE SPNL 18GX3.5 QUINCKE PK (NEEDLE) ×2 IMPLANT
NS IRRIG 1000ML POUR BTL (IV SOLUTION) ×2 IMPLANT
PACK BASIN DAY SURGERY FS (CUSTOM PROCEDURE TRAY) ×2 IMPLANT
PENCIL SMOKE EVACUATOR (MISCELLANEOUS) ×2 IMPLANT
PIN SAFETY STERILE (MISCELLANEOUS) IMPLANT
SHEET MEDIUM DRAPE 40X70 STRL (DRAPES) ×1 IMPLANT
SLEEVE SCD COMPRESS KNEE MED (STOCKING) ×2 IMPLANT
SPONGE LAP 18X18 RF (DISPOSABLE) ×6 IMPLANT
STAPLER INSORB 30 2030 C-SECTI (MISCELLANEOUS) ×3 IMPLANT
STAPLER VISISTAT 35W (STAPLE) ×2 IMPLANT
STRIP SUTURE WOUND CLOSURE 1/2 (MISCELLANEOUS) ×6 IMPLANT
SUT CHROMIC 4 0 PS 2 18 (SUTURE) IMPLANT
SUT ETHILON 2 0 FS 18 (SUTURE) IMPLANT
SUT ETHILON 3 0 PS 1 (SUTURE) IMPLANT
SUT MNCRL AB 4-0 PS2 18 (SUTURE) ×5 IMPLANT
SUT PDS 3-0 CT2 (SUTURE) ×8
SUT PDS II 3-0 CT2 27 ABS (SUTURE) ×2 IMPLANT
SUT VIC AB 3-0 PS1 18 (SUTURE)
SUT VIC AB 3-0 PS1 18XBRD (SUTURE) IMPLANT
SUT VLOC 180 P-14 24 (SUTURE) ×4 IMPLANT
SYR 50ML LL SCALE MARK (SYRINGE) IMPLANT
SYR BULB IRRIG 60ML STRL (SYRINGE) ×2 IMPLANT
SYR CONTROL 10ML LL (SYRINGE) IMPLANT
TAPE MEASURE VINYL STERILE (MISCELLANEOUS) IMPLANT
TOWEL GREEN STERILE FF (TOWEL DISPOSABLE) ×4 IMPLANT
TUBE CONNECTING 20X1/4 (TUBING) ×2 IMPLANT
TUBING INFILTRATION IT-10001 (TUBING) ×2 IMPLANT
UNDERPAD 30X36 HEAVY ABSORB (UNDERPADS AND DIAPERS) ×4 IMPLANT
YANKAUER SUCT BULB TIP NO VENT (SUCTIONS) ×2 IMPLANT

## 2021-02-21 NOTE — Anesthesia Procedure Notes (Signed)
Procedure Name: Intubation Date/Time: 02/21/2021 12:11 PM Performed by: Glory Buff, CRNA Pre-anesthesia Checklist: Patient identified, Emergency Drugs available, Suction available and Patient being monitored Patient Re-evaluated:Patient Re-evaluated prior to induction Oxygen Delivery Method: Circle system utilized Preoxygenation: Pre-oxygenation with 100% oxygen Induction Type: IV induction Ventilation: Mask ventilation without difficulty Laryngoscope Size: Miller and 3 Grade View: Grade I Tube type: Oral Tube size: 7.0 mm Number of attempts: 1 Airway Equipment and Method: Stylet and Oral airway Placement Confirmation: ETT inserted through vocal cords under direct vision,  positive ETCO2 and breath sounds checked- equal and bilateral Secured at: 21 cm Tube secured with: Tape Dental Injury: Teeth and Oropharynx as per pre-operative assessment

## 2021-02-21 NOTE — Brief Op Note (Signed)
02/21/2021  1:57 PM  PATIENT:  Diamond Le  55 y.o. female  PRE-OPERATIVE DIAGNOSIS:  macromastia  POST-OPERATIVE DIAGNOSIS:  macromastia  PROCEDURE:  Procedure(s): MAMMARY REDUCTION  (BREAST) (Bilateral)  SURGEON:  Surgeon(s) and Role:    * Kathlen Sakurai, Steffanie Dunn, MD - Primary  PHYSICIAN ASSISTANT: [place], RNFA  ASSISTANTS: none   ANESTHESIA:   general  EBL:  20 mL   BLOOD ADMINISTERED:none  DRAINS: none   LOCAL MEDICATIONS USED:  MARCAINE     SPECIMEN:  Source of Specimen:  r and l breast tissue  DISPOSITION OF SPECIMEN:  PATHOLOGY  COUNTS:  YES  TOURNIQUET:  * No tourniquets in log *  DICTATION: .Dragon Dictation  PLAN OF CARE: Discharge to home after PACU  PATIENT DISPOSITION:  PACU - hemodynamically stable.   Delay start of Pharmacological VTE agent (>24hrs) due to surgical blood loss or risk of bleeding: not applicable

## 2021-02-21 NOTE — Interval H&P Note (Signed)
History and Physical Interval Note:  02/21/2021 10:29 AM  Diamond Le  has presented today for surgery, with the diagnosis of macromastia.  The various methods of treatment have been discussed with the patient and family. After consideration of risks, benefits and other options for treatment, the patient has consented to  Procedure(s): MAMMARY REDUCTION  (BREAST) (Bilateral) as a surgical intervention.  The patient's history has been reviewed, patient examined, no change in status, stable for surgery.  I have reviewed the patient's chart and labs.  Questions were answered to the patient's satisfaction.     [name]

## 2021-02-21 NOTE — Anesthesia Preprocedure Evaluation (Addendum)
Anesthesia Evaluation  Patient identified by MRN, date of birth, ID band Patient awake    Reviewed: Allergy & Precautions, NPO status , Patient's Chart, lab work & pertinent test results  History of Anesthesia Complications Negative for: history of anesthetic complications  Airway Mallampati: II  TM Distance: >3 FB Neck ROM: Full    Dental  (+) Missing,    Pulmonary neg pulmonary ROS,    Pulmonary exam normal        Cardiovascular hypertension, Pt. on medications Normal cardiovascular exam     Neuro/Psych negative neurological ROS  negative psych ROS   GI/Hepatic Neg liver ROS, GERD  Controlled and Medicated,  Endo/Other  negative endocrine ROS  Renal/GU negative Renal ROS  negative genitourinary   Musculoskeletal negative musculoskeletal ROS (+)   Abdominal   Peds  Hematology  (+) anemia ,   Anesthesia Other Findings Day of surgery medications reviewed with patient.  Reproductive/Obstetrics negative OB ROS                            Anesthesia Physical Anesthesia Plan  ASA: II  Anesthesia Plan: General   Post-op Pain Management:    Induction: Intravenous  PONV Risk Score and Plan: 3 and Treatment may vary due to age or medical condition, Ondansetron, Dexamethasone, Midazolam and Scopolamine patch - Pre-op  Airway Management Planned: Oral ETT  Additional Equipment: None  Intra-op Plan:   Post-operative Plan: Extubation in OR  Informed Consent: I have reviewed the patients History and Physical, chart, labs and discussed the procedure including the risks, benefits and alternatives for the proposed anesthesia with the patient or authorized representative who has indicated his/her understanding and acceptance.     Dental advisory given  Plan Discussed with: CRNA  Anesthesia Plan Comments:        Anesthesia Quick Evaluation

## 2021-02-21 NOTE — Op Note (Signed)
Operative Note   DATE OF OPERATION: 02/21/2021  LOCATION: [facility]   SURGICAL DEPARTMENT: Plastic Surgery  PREOPERATIVE DIAGNOSES: Bilateral symptomatic macromastia.  POSTOPERATIVE DIAGNOSES:  same  PROCEDURE: Bilateral breast reduction with superomedial pedicle.  SURGEON: Talmadge Coventry, MD  ASSISTANT: [place], RNFA The advanced practice practitioner (APP) assisted throughout the case.  The APP was essential in retraction and counter traction when needed to make the case progress smoothly.  This retraction and assistance made it possible to see the tissue plans for the procedure.  The assistance was needed for blood control, tissue re-approximation and assisted with closure of the incision site.  ANESTHESIA: General.  COMPLICATIONS: None.   INDICATIONS FOR PROCEDURE:  The patient, Diamond Le is a 55 y.o. female born on 09/26/1966, is here for treatment of bilateral symptomatic macromastia. MRN: [id number]  CONSENT:  Informed consent was obtained directly from the patient. Risks, benefits and alternatives were fully discussed. Specific risks including but not limited to bleeding, infection, hematoma, seroma, scarring, pain, infection, contracture, asymmetry, wound healing problems, and need for further surgery were all discussed. The patient did have an ample opportunity to have questions answered to satisfaction.   DESCRIPTION OF PROCEDURE:  The patient was marked preoperatively for a Wise pattern skin excision.  The patient was taken to the operating room. SCDs were placed and antibiotics were given. General anesthesia was administered.The patient's operative site was prepped and draped in a sterile fashion. A time out was performed and all information was confirmed to be correct.  Right Breast: The breast was infiltrated with tumescent solution to help with hemostasis.  The nipple was marked with a cookie cutter.  A superomedial pedicle was drawn out with  the base of at least 8 cm in size.  A breast tourniquet was then applied and the pedicle was de-epithelialized.  Breast tourniquet was then let down and all incisions were made with a 10 blade.  The pedicle was then isolated down to the chest wall with cautery and the excision was performed removing tissue primarily inferiorly and laterally.  Hemostasis was obtained and the wound was stapled closed.  Left breast:  The breast was infiltrated with tumescent solution to help with hemostasis.  The nipple was marked with a cookie cutter.  A superomedial pedicle was drawn out with the base of at least 8 cm in size.  A breast tourniquet was then applied and the pedicle was de-epithelialized.  Breast tourniquet was then let down and all incisions were made with a 10 blade.  The pedicle was then isolated down to the chest wall with cautery and the excision was performed removing tissue primarily inferiorly and laterally.  Hemostasis was obtained and the wound was stapled closed.  Patient was then set up to check for size and symmetry.  Minor modifications were made.  This resulted in a total of 1185g removed from the right side and 1326g removed from the left side.  The inframammary incision was closed with a combination of buried in-sorb staples and a running 3-0 Quill suture.  The vertical and periareolar limbs were closed with interrupted buried 4-0 Monocryl and a running 4-0 Quill suture.  Steri-Strips were then applied along with a soft dressing and Ace wrap.  The patient tolerated the procedure well.  There were no complications. The patient was allowed to wake from anesthesia, extubated and taken to the recovery room in satisfactory condition.  I was present for the entire procedure.

## 2021-02-21 NOTE — Transfer of Care (Signed)
Immediate Anesthesia Transfer of Care Note  Patient: Diamond Le  Procedure(s) Performed: MAMMARY REDUCTION  (BREAST) (Bilateral Breast)  Patient Location: PACU  Anesthesia Type:General  Level of Consciousness: drowsy, patient cooperative and responds to stimulation  Airway & Oxygen Therapy: Patient Spontanous Breathing and Patient connected to face mask oxygen  Post-op Assessment: Report given to RN and Post -op Vital signs reviewed and stable  Post vital signs: Reviewed and stable  Last Vitals:  Vitals Value Taken Time  BP    Temp    Pulse 74 02/21/21 1407  Resp    SpO2 100 % 02/21/21 1407  Vitals shown include unvalidated device data.  Last Pain:  Vitals:   02/21/21 0948  TempSrc: Oral  PainSc: 0-No pain      Patients Stated Pain Goal: 3 (40/45/91 3685)  Complications: No complications documented.

## 2021-02-21 NOTE — H&P (Signed)
Subjective [] Expand by Default   Patient ID: Diamond Le, female    DOB: [birth year], 55 y.o.   MRN: [id number]     Chief Complaint  Patient presents with  . Pre-op Exam      ICD-10-CM   1. Macromastia  N62   2. Back pain of thoracolumbar region  M54.50    M54.6   3. Postural kyphosis, thoracic region  M40.04      History of Present Illness: Diamond Le is a 55 y.o.  female  with a history of macromastia.  She presents for preoperative evaluation for upcoming procedure, Bilateral Breast Reduction, scheduled for 02/21/2021 with Dr.  Claudia Desanctis  The patient has not had problems with anesthesia. No history of DVT/PE.  No family history of DVT/PE.  No family or personal history of bleeding or clotting disorders.  Patient is not currently taking any blood thinners.  No history of CVA/MI.   Summary of Previous Visit: Patient has had years of back pain neck pain and shoulder grooving related to her large breasts.  She is currently 40 double D and wants to be around a C cup. She is not a smoker or diabetic.  She has not had any previous breast biopsies or procedures.  Anticipate approximately 950 g of tissue removed from each side.  Patient had a mammogram on 12/29/2020 which was negative.  Job: Midwife, desk job  PMH Significant for: Hypertension, vitamin D deficiency.  Past Medical History: Allergies: No Known Allergies  Current Medications:  Current Outpatient Medications:  .  HYDROcodone-acetaminophen (NORCO) 5-325 MG tablet, Take 1 tablet by mouth every 6 (six) hours as needed for up to 5 days for severe pain., Disp: 20 tablet, Rfl: 0 .  ondansetron (ZOFRAN) 4 MG tablet, Take 1 tablet (4 mg total) by mouth every 8 (eight) hours as needed for nausea or vomiting., Disp: 20 tablet, Rfl: 0 .  amLODipine (NORVASC) 10 MG tablet, Take 1 tablet (10 mg total) by mouth daily., Disp: 30 tablet, Rfl: 5 .  cholecalciferol (VITAMIN D3) 25 MCG (1000 UNIT)  tablet, Take 1,000 Units by mouth daily., Disp: , Rfl:  .  dicyclomine (BENTYL) 10 MG capsule, Take 1 capsule (10 mg total) by mouth 3 (three) times daily as needed for spasms (Cramping)., Disp: 20 capsule, Rfl: 0 .  Ferrous Sulfate (IRON) 325 (65 Fe) MG TABS, Take one tablet by mouth once daily, Disp: 90 tablet, Rfl: 1 .  lisinopril (ZESTRIL) 5 MG tablet, Take 1 tablet (5 mg total) by mouth daily., Disp: 30 tablet, Rfl: 6 .  pantoprazole (PROTONIX) 40 MG tablet, Take 1 tablet (40 mg total) by mouth daily., Disp: 30 tablet, Rfl: 1 .  Simethicone 80 MG TABS, Take 1 tablet (80 mg total) by mouth 2 (two) times daily as needed (Bloating)., Disp: 20 tablet, Rfl: 0 .  spironolactone (ALDACTONE) 50 MG tablet, TAKE 1 TABLET(50 MG) BY MOUTH DAILY, Disp: 30 tablet, Rfl: 4  Past Medical Problems:     Past Medical History:  Diagnosis Date  . Folliculitis 8/50/2774  . Hypertension   . Obesity   . Palpitation   . Superficial fungus infection of skin 04/19/2015  . Vitamin D deficiency     Past Surgical History:      Past Surgical History:  Procedure Laterality Date  . BREAST CYST EXCISION Right 12/14/2016  . MASS EXCISION Right 12/14/2016   Procedure: EXCISION CYST RIGHT AXILLA;  Surgeon: Aviva Signs, MD;  Location: [facility];  Service: General;  Laterality: Right;    Social History: Social History        Socioeconomic History  . Marital status: Married    Spouse name: Not on file  . Number of children: 2  . Years of education: Not on file  . Highest education level: Not on file  Occupational History  . Occupation: Marine scientist at [employer]  . Smoking status: Never Smoker  . Smokeless tobacco: Never Used  Vaping Use  . Vaping Use: Never used  Substance and Sexual Activity  . Alcohol use: No  . Drug use: No  . Sexual activity: Yes    Birth control/protection: None, Condom  Other Topics Concern  . Not on file  Social History Narrative  . Not on file   Social  Determinants of Health   Financial Resource Strain: Not on file  Food Insecurity: Not on file  Transportation Needs: Not on file  Physical Activity: Not on file  Stress: Not on file  Social Connections: Not on file  Intimate Partner Violence: Not on file    Family History: Family History  Problem Relation Age of Onset  . Asthma Mother        most of her life   . Cancer Father        lung  . Cancer Paternal Aunt        cervical   . Hypertension Maternal Grandmother   . Ovarian cancer Other     Review of Systems: Review of Systems  Constitutional: Negative.   Respiratory: Negative.   Cardiovascular: Negative.   Gastrointestinal: Negative.   Musculoskeletal: Negative.   Neurological: Negative.     Physical Exam: Vital Signs BP (!) 144/84 (BP Location: Left Arm, Patient Position: Sitting, Cuff Size: Large)   Pulse 96   Ht 5\' 5"  (1.651 m)   Wt 234 lb (106.1 kg)   SpO2 98%   BMI 38.94 kg/m   Physical Exam  Constitutional:      General: Not in acute distress.    Appearance: Normal appearance. Not ill-appearing.  HENT:     Head: Normocephalic and atraumatic.  Eyes:     Pupils: Pupils are equal, round Neck:     Musculoskeletal: Normal range of motion.  Cardiovascular:     Rate and Rhythm: Normal rate and regular rhythm.     Pulses: Normal pulses.     Heart sounds: Normal heart sounds. No murmur.  Pulmonary:     Effort: Pulmonary effort is normal. No respiratory distress.     Breath sounds: Normal breath sounds. No wheezing.  Abdominal:     General: Abdomen is flat. There is no distension.     Palpations: Abdomen is soft.     Tenderness: There is no abdominal tenderness.  Musculoskeletal: Normal range of motion.  Skin:    General: Skin is warm and dry.     Findings: No erythema or rash.  Neurological:     General: No focal deficit present.     Mental Status: Alert and oriented to person, place, and time. Mental status is at baseline.      Motor: No weakness.  Psychiatric:        Mood and Affect: Mood normal.        Behavior: Behavior normal.    Assessment/Plan: The patient is scheduled for bilateral breast reduction with Dr. Claudia Desanctis.  Risks, benefits, and alternatives of procedure discussed, questions answered and consent obtained.    Smoking Status: Non-smoker; Counseling  Given?  N/A Last Mammogram: 12/29/2020; Results: Negative  Caprini Score: 4, moderate; Risk Factors include: Age, BMI greater than 25, and length of planned surgery. Recommendation for mechanical and pharmacological prophylaxis while hospitalized. Encourage early ambulation.   Pictures obtained:[email or web address]   Post-op Rx sent to pharmacy: Norco, Zofran  Patient was provided with the breast reduction and General Surgical Risk consent document and Pain Medication Agreement prior to their appointment.  They had adequate time to read through the risk consent documents and Pain Medication Agreement. We also discussed them in person together during this preop appointment. All of their questions were answered to their satisfaction.  Recommended calling if they have any further questions.  Risk consent form and Pain Medication Agreement to be scanned into patient's chart.  The risk that can be encountered with breast reduction were discussed and include the following but not limited to these:  Breast asymmetry, fluid accumulation, firmness of the breast, inability to breast feed, loss of nipple or areola, skin loss, decrease or no nipple sensation, fat necrosis of the breast tissue, bleeding, infection, healing delay.  There are risks of anesthesia, changes to skin sensation and injury to nerves or blood vessels.  The muscle can be temporarily or permanently injured.  You may have an allergic reaction to tape, suture, glue, blood products which can result in skin discoloration, swelling, pain, skin lesions, poor healing.  Any of these can lead to the need for revisonal  surgery or stage procedures.  A reduction has potential to interfere with diagnostic procedures.  Nipple or breast piercing can increase risks of infection.  This procedure is best done when the breast is fully developed.  Changes in the breast will continue to occur over time.  Pregnancy can alter the outcomes of previous breast reduction surgery, weight gain and weigh loss can also effect the long term appearance.

## 2021-02-21 NOTE — Discharge Instructions (Signed)
Activity As tolerated: NO showers for 3 days No heavy activities  Diet: Regular  Wound Care: Keep dressing clean & dry for 3 days.  Keep wrap applied with compression as much as possible.    Do not change dressings for 3 days unless soiled.  Can change if needed but make sure to reapply wrap. After three days can remove wrap and shower.  Then reapply dressings if needed and continue compression with wrap or soft sports bra. Call doctor if any unusual problems occur such as pain, excessive bleeding, unrelieved nausea/vomiting, fever &/or chills  Follow-up appointment: Scheduled for next week.  May take Tylenol after 4pm, if needed.    Post Anesthesia Home Care Instructions  Activity: Get plenty of rest for the remainder of the day. A responsible individual must stay with you for 24 hours following the procedure.  For the next 24 hours, DO NOT: -Drive a car -Paediatric nurse -Drink alcoholic beverages -Take any medication unless instructed by your physician -Make any legal decisions or sign important papers.  Meals: Start with liquid foods such as gelatin or soup. Progress to regular foods as tolerated. Avoid greasy, spicy, heavy foods. If nausea and/or vomiting occur, drink only clear liquids until the nausea and/or vomiting subsides. Call your physician if vomiting continues.  Special Instructions/Symptoms: Your throat may feel dry or sore from the anesthesia or the breathing tube placed in your throat during surgery. If this causes discomfort, gargle with warm salt water. The discomfort should disappear within 24 hours.  If you had a scopolamine patch placed behind your ear for the management of post- operative nausea and/or vomiting:  1. The medication in the patch is effective for 72 hours, after which it should be removed.  Wrap patch in a tissue and discard in the trash. Wash hands thoroughly with soap and water. 2. You may remove the patch earlier than 72 hours if you  experience unpleasant side effects which may include dry mouth, dizziness or visual disturbances. 3. Avoid touching the patch. Wash your hands with soap and water after contact with the patch.

## 2021-02-22 ENCOUNTER — Encounter: Payer: Commercial Managed Care - PPO | Admitting: Surgical

## 2021-02-22 ENCOUNTER — Encounter (HOSPITAL_BASED_OUTPATIENT_CLINIC_OR_DEPARTMENT_OTHER): Payer: Self-pay | Admitting: Plastic Surgery

## 2021-02-22 NOTE — Anesthesia Postprocedure Evaluation (Signed)
Anesthesia Post Note  Patient: Diamond Le  Procedure(s) Performed: MAMMARY REDUCTION  (BREAST) (Bilateral Breast)     Patient location during evaluation: PACU Anesthesia Type: General Level of consciousness: awake and alert Pain management: pain level controlled Vital Signs Assessment: post-procedure vital signs reviewed and stable Respiratory status: spontaneous breathing, nonlabored ventilation, respiratory function stable and patient connected to nasal cannula oxygen Cardiovascular status: blood pressure returned to baseline and stable Postop Assessment: no apparent nausea or vomiting Anesthetic complications: no   No complications documented.  Last Vitals:  Vitals:   02/21/21 1500 02/21/21 1545  BP: 117/80 118/77  Pulse: 66 65  Resp: 16 16  Temp:  36.5 C  SpO2: 95% 100%    Last Pain:  Vitals:   02/21/21 1630  TempSrc:   PainSc: 7                  Temia Debroux S

## 2021-02-23 LAB — SURGICAL PATHOLOGY

## 2021-03-02 ENCOUNTER — Encounter: Payer: Self-pay | Admitting: Plastic Surgery

## 2021-03-02 ENCOUNTER — Ambulatory Visit (INDEPENDENT_AMBULATORY_CARE_PROVIDER_SITE_OTHER): Payer: Commercial Managed Care - PPO | Admitting: Plastic Surgery

## 2021-03-02 ENCOUNTER — Other Ambulatory Visit: Payer: Self-pay

## 2021-03-02 VITALS — BP 121/84 | HR 98

## 2021-03-02 DIAGNOSIS — N62 Hypertrophy of breast: Secondary | ICD-10-CM

## 2021-03-02 NOTE — Progress Notes (Signed)
Patient presents about 1 week out from bilateral breast reduction.  She feels good and is happy.  On exam everything looks to be healing fine with intact incisions and viable nipple areolar complexes.  There is no subcutaneous fluid.  I have asked her to continue compressive garments and avoid strenuous activity.  We will see her again in a few weeks.

## 2021-03-08 ENCOUNTER — Other Ambulatory Visit: Payer: Self-pay | Admitting: Family Medicine

## 2021-03-14 ENCOUNTER — Telehealth: Payer: Self-pay | Admitting: Plastic Surgery

## 2021-03-14 NOTE — Telephone Encounter (Signed)
Patient called to advise that she has noticed some greenish-yellow discharge and a foul smell. There is a small knot as well. Please call patient to advise if she needs to be seen sooner than this Friday.

## 2021-03-14 NOTE — Telephone Encounter (Signed)
Returned patients call. Breast reduction surgery was performed on 02/21/21.  On 02/25/2021 she noticed when changing dressings from the incision area starting from left breast under the arm,  there was a very small amount of greenish/yellowish drainage on the gauze. Yesterday she noticed an odor. Steri strips are still in place, but could possibly causing some of the smell.  She also indicated there is a small knot above her left nipple. Denies any redness, swelling, fever, chills, nausea, nor vomiting. To prevent any skin irritation, she is using ABD pads under elastic band of sports bra. Under the advise from [facility], apply Vaseline to the incision area and cover with guaze. We will see her at her next appointment 03/17/21 at 10:20am with [facility]. Should there be any changes until then, to call our office. Patient understood and agreed with plan.

## 2021-03-17 ENCOUNTER — Other Ambulatory Visit: Payer: Self-pay

## 2021-03-17 ENCOUNTER — Encounter: Payer: Self-pay | Admitting: Surgical

## 2021-03-17 ENCOUNTER — Ambulatory Visit (INDEPENDENT_AMBULATORY_CARE_PROVIDER_SITE_OTHER): Payer: Commercial Managed Care - PPO | Admitting: Surgical

## 2021-03-17 VITALS — BP 135/73 | HR 96

## 2021-03-17 DIAGNOSIS — M4004 Postural kyphosis, thoracic region: Secondary | ICD-10-CM

## 2021-03-17 DIAGNOSIS — M545 Low back pain, unspecified: Secondary | ICD-10-CM

## 2021-03-17 DIAGNOSIS — M546 Pain in thoracic spine: Secondary | ICD-10-CM

## 2021-03-17 DIAGNOSIS — N62 Hypertrophy of breast: Secondary | ICD-10-CM

## 2021-03-17 NOTE — Progress Notes (Signed)
Patient is a 55 year old female here for follow-up after bilateral breast reduction on 02/21/2021 with Dr. Claudia Desanctis.  She is just shy of 4 weeks postop.  Patient reports that she has noticed a foul odor from her breast and some drainage on her gauze.  She reports otherwise she is doing well.  Chaperone present on exam On exam bilateral NAC's are viable.  Right breast incisions are intact and healing nicely.  Left breast has a small wound at the junction of the vertical limb and inframammary fold that is approximately 3 mm.  There is no surrounding erythema.  No foul odor noted today on exam.  No drainage is noted.  There is some tenderness to palpation.  No cellulitic changes are noted.  No areas of fluctuance noted with palpation.  Discussed with the patient that this is a common place to develop a wound, I recommend Vaseline and gauze to this area daily.  I recommend she continue to avoid strenuous activities or heavy lifting.  We discussed returning to work.  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  I discussed with her that returning to work next week with restrictions of no heavy lifting greater than 15 pounds would be fine, if she is unable to return with those restrictions she would need to remain out of work for a total of 6 weeks after surgery.  All of her questions were answered to her content.  I do not see any signs of infection, seroma, hematoma.  Recommend following up in 2 to 3 weeks for reevaluation

## 2021-03-27 ENCOUNTER — Ambulatory Visit (INDEPENDENT_AMBULATORY_CARE_PROVIDER_SITE_OTHER): Payer: Commercial Managed Care - PPO | Admitting: Gastroenterology

## 2021-03-30 ENCOUNTER — Other Ambulatory Visit: Payer: Self-pay | Admitting: Family Medicine

## 2021-04-10 ENCOUNTER — Other Ambulatory Visit: Payer: Self-pay

## 2021-04-10 ENCOUNTER — Ambulatory Visit (INDEPENDENT_AMBULATORY_CARE_PROVIDER_SITE_OTHER): Payer: Commercial Managed Care - PPO | Admitting: Surgical

## 2021-04-10 ENCOUNTER — Encounter: Payer: Self-pay | Admitting: Surgical

## 2021-04-10 DIAGNOSIS — M546 Pain in thoracic spine: Secondary | ICD-10-CM

## 2021-04-10 DIAGNOSIS — M545 Low back pain, unspecified: Secondary | ICD-10-CM

## 2021-04-10 DIAGNOSIS — N62 Hypertrophy of breast: Secondary | ICD-10-CM

## 2021-04-10 DIAGNOSIS — Z9889 Other specified postprocedural states: Secondary | ICD-10-CM

## 2021-04-10 NOTE — Progress Notes (Signed)
Patient is a 55 year old female here for follow-up after bilateral breast reduction with Dr. Claudia Desanctis on 02/21/2021.  She is 6 weeks postop.  She is doing really well.  She has some questions about restrictions.  Chaperone present on exam On exam bilateral breast incisions are intact, bilateral NAC's are viable.  No subcutaneous fluid collections noted with palpation.  No erythema or cellulitic changes noted.  Everything appears to be healing very nicely.  Recommend sports bra for 1 more month then she can transition to wearing a normal bra without an underwire.  She can begin using scar cream at this point.  No restrictions.  Recommend calling with questions or concerns.  We discussed scheduling an additional follow-up or following up as needed.  Patient is comfortable following up on an as-needed basis.  She knows we can see her at any time.

## 2021-05-03 ENCOUNTER — Ambulatory Visit (INDEPENDENT_AMBULATORY_CARE_PROVIDER_SITE_OTHER): Payer: Commercial Managed Care - PPO | Admitting: Plastic Surgery

## 2021-05-03 ENCOUNTER — Other Ambulatory Visit: Payer: Self-pay

## 2021-05-03 DIAGNOSIS — N62 Hypertrophy of breast: Secondary | ICD-10-CM

## 2021-05-03 NOTE — Progress Notes (Signed)
Patient presents little over 2 months out from bilateral breast reduction.  She went to [place] recently and felt like she is noticed some erythema and drainage from her breast.  She reports small little bumps that are draining clear fluid.  She does not recall any particular substance or clothing that would cause her to have a reaction.  On exam she has a nice result from her breast reduction.  There is some faint blisterlike papules on both sides but with the left side being the worst.  There is a very faint erythema around the skin of the left breast also.  I do not see any wounds or palpate any subcutaneous fluid.  I recommended topical over-the-counter steroid cream for what I think may be a mild contact dermatitis.  She says that this is getting better day by day but wanted to come have it checked.  If it continues to worsen or there is any other alarming features she will give [place] a call.  All of her questions were answered.

## 2021-05-18 ENCOUNTER — Ambulatory Visit: Payer: Commercial Managed Care - PPO | Admitting: Family Medicine

## 2021-06-08 ENCOUNTER — Other Ambulatory Visit: Payer: Self-pay

## 2021-06-08 ENCOUNTER — Encounter: Payer: Self-pay | Admitting: Family Medicine

## 2021-06-08 ENCOUNTER — Ambulatory Visit (INDEPENDENT_AMBULATORY_CARE_PROVIDER_SITE_OTHER): Payer: Commercial Managed Care - PPO | Admitting: Family Medicine

## 2021-06-08 VITALS — BP 131/84 | HR 71 | Resp 15 | Ht 65.0 in | Wt 231.1 lb

## 2021-06-08 DIAGNOSIS — E559 Vitamin D deficiency, unspecified: Secondary | ICD-10-CM

## 2021-06-08 DIAGNOSIS — M7989 Other specified soft tissue disorders: Secondary | ICD-10-CM | POA: Diagnosis not present

## 2021-06-08 DIAGNOSIS — I10 Essential (primary) hypertension: Secondary | ICD-10-CM | POA: Diagnosis not present

## 2021-06-08 DIAGNOSIS — K219 Gastro-esophageal reflux disease without esophagitis: Secondary | ICD-10-CM

## 2021-06-08 DIAGNOSIS — E669 Obesity, unspecified: Secondary | ICD-10-CM

## 2021-06-08 DIAGNOSIS — D508 Other iron deficiency anemias: Secondary | ICD-10-CM | POA: Diagnosis not present

## 2021-06-08 DIAGNOSIS — Z13220 Encounter for screening for lipoid disorders: Secondary | ICD-10-CM

## 2021-06-08 MED ORDER — PANTOPRAZOLE SODIUM 20 MG PO TBEC: 20.0000 mg | DELAYED_RELEASE_TABLET | Freq: Every day | ORAL | 1 refills | Status: DC

## 2021-06-08 NOTE — Assessment & Plan Note (Addendum)
Controlled, no change in medication  DASH diet and commitment to daily physical activity for a minimum of 30 minutes discussed and encouraged, as a part of hypertension management. The importance of attaining a healthy weight is also discussed.  BP/Weight 06/08/2021 03/17/2021 03/02/2021 02/21/2021 02/06/2021 01/22/2021 0/92/9574  Systolic BP 734 037 096 438 381 840 -  Diastolic BP 84 73 84 77 65 75 -  Wt. (Lbs) 231.12 - - 230.6 233.5 226 226  BMI 38.46 - - 38.37 38.86 37.61 37.61

## 2021-06-08 NOTE — Patient Instructions (Signed)
Follow-up in 4 months call if you need me sooner.Shingrix #2 at visit  Nurse visit for shingrix #1 in August    You are referred for an ultrasound of your left leg due to the pain and swelling for the past week.  Please follow through and get your colonoscopy.  Fasting lipid CMP and EGFR and vit D  3 days before your next visit.  It is important that you exercise regularly at least 30 minutes 5 times a week. If you develop chest pain, have severe difficulty breathing, or feel very tired, stop exercising immediately and seek medical attention  Think about what you will eat, plan ahead. Choose " clean, green, fresh or frozen" over canned, processed or packaged foods which are more sugary, salty and fatty. 70 to 75% of food eaten should be vegetables and fruit. Three meals at set times with snacks allowed between meals, but they must be fruit or vegetables. Aim to eat over a 12 hour period , example 7 am to 7 pm, and STOP after  your last meal of the day. Drink water,generally about 64 ounces per day, no other drink is as healthy. Fruit juice is best enjoyed in a healthy way, by EATING the fruit. Thanks for choosing [facility], we consider it a privelige to serve you.

## 2021-06-10 ENCOUNTER — Other Ambulatory Visit: Payer: Self-pay | Admitting: Family Medicine

## 2021-06-12 ENCOUNTER — Other Ambulatory Visit (INDEPENDENT_AMBULATORY_CARE_PROVIDER_SITE_OTHER): Payer: Self-pay

## 2021-06-12 ENCOUNTER — Encounter: Payer: Self-pay | Admitting: Family Medicine

## 2021-06-12 DIAGNOSIS — Z1211 Encounter for screening for malignant neoplasm of colon: Secondary | ICD-10-CM

## 2021-06-12 NOTE — Progress Notes (Signed)
Diamond Le     MRN: [id number]      DOB: 10-20-1966   HPI Diamond Le is here for follow up and re-evaluation of chronic medical conditions, medication management and review of any available recent lab and radiology data.  Preventive health is updated, specifically  Cancer screening and Immunization.   Questions or concerns regarding consultations or procedures which the PT has had in the interim are  addressed. The PT denies any adverse reactions to current medications since the last visit.  Several month h/o tender swelling on front of left leg intermittently sometimes tender, no known trauma, notes increased pain and swelling of left leg in past 1 week  ROS Denies recent fever or chills. Denies sinus pressure, nasal congestion, ear pain or sore throat. Denies chest congestion, productive cough or wheezing. Denies chest pains, palpitations , PND or orthopnea Denies abdominal pain, nausea, vomiting,diarrhea or constipation.   Denies dysuria, frequency, hesitancy or incontinence. Denies joint pain, swelling and limitation in mobility. Denies headaches, seizures, numbness, or tingling. Denies depression, anxiety or insomnia. Denies skin break down or rash.   PE  BP 131/84   Pulse 71   Resp 15   Ht 5\' 5"  (1.651 m)   Wt 231 lb 1.9 oz (104.8 kg)   SpO2 96%   BMI 38.46 kg/m   Patient alert and oriented and in no cardiopulmonary distress.  HEENT: No facial asymmetry, EOMI,     Neck supple .  Chest: Clear to auscultation bilaterally.  CVS: S1, S2 no murmurs, no S3.Regular rate.  ABD: Soft non tender.   Ext: No edema, left calf approx 2 in larger than right, no warmth or tenderness, negative homanns Mo palpable nodule on lower extremity MS: Adequate ROM spine, shoulders, hips and knees.  Skin: Intact, no ulcerations or rash noted.  Psych: Good eye contact, normal affect. Memory intact not anxious or depressed appearing.  CNS: CN 2-12 intact, power,  normal  throughout.no focal deficits noted.   Assessment & Plan  Essential hypertension Controlled, no change in medication  DASH diet and commitment to daily physical activity for a minimum of 30 minutes discussed and encouraged, as a part of hypertension management. The importance of attaining a healthy weight is also discussed.  BP/Weight 06/08/2021 03/17/2021 03/02/2021 02/21/2021 02/06/2021 01/22/2021 3/55/9741  Systolic BP 638 453 646 803 212 248 -  Diastolic BP 84 73 84 77 65 75 -  Wt. (Lbs) 231.12 - - 230.6 233.5 226 226  BMI 38.46 - - 38.37 38.86 37.61 37.61    Left leg swelling 1 week h/o pain and swelling of left leg, intemittent palpable left anterior leg nodule also reported, refer for [place] leg, venous study  GERD (gastroesophageal reflux disease) Controlled, no change in medication   Obesity (BMI 30-39.9)  Patient re-educated about  the importance of commitment to a  minimum of 150 minutes of exercise per week as able.  The importance of healthy food choices with portion control discussed, as well as eating regularly and within a 12 hour window most days. The need to choose "clean , green" food 50 to 75% of the time is discussed, as well as to make water the primary drink and set a goal of 64 ounces water daily.    Weight /BMI 06/08/2021 02/21/2021 02/06/2021  WEIGHT 231 lb 1.9 oz 230 lb 9.6 oz 233 lb 8 oz  HEIGHT 5\' 5"  5\' 5"  5\' 5"   BMI 38.46 kg/m2 38.37 kg/m2 38.86 kg/m2  Vitamin D deficiency Needs weekly Vit D  IDA (iron deficiency anemia) Continue iron supplement

## 2021-06-12 NOTE — Assessment & Plan Note (Signed)
  Patient re-educated about  the importance of commitment to a  minimum of 150 minutes of exercise per week as able.  The importance of healthy food choices with portion control discussed, as well as eating regularly and within a 12 hour window most days. The need to choose "clean , green" food 50 to 75% of the time is discussed, as well as to make water the primary drink and set a goal of 64 ounces water daily.    Weight /BMI 06/08/2021 02/21/2021 02/06/2021  WEIGHT 231 lb 1.9 oz 230 lb 9.6 oz 233 lb 8 oz  HEIGHT 5\' 5"  5\' 5"  5\' 5"   BMI 38.46 kg/m2 38.37 kg/m2 38.86 kg/m2

## 2021-06-12 NOTE — Assessment & Plan Note (Signed)
Needs weekly Vit D

## 2021-06-12 NOTE — Assessment & Plan Note (Signed)
Controlled, no change in medication  

## 2021-06-12 NOTE — Assessment & Plan Note (Addendum)
1 week h/o pain and swelling of left leg, intemittent palpable left anterior leg nodule also reported, refer for [place] leg, venous study

## 2021-06-12 NOTE — Assessment & Plan Note (Signed)
Continue iron supplement.

## 2021-06-13 ENCOUNTER — Telehealth (INDEPENDENT_AMBULATORY_CARE_PROVIDER_SITE_OTHER): Payer: Self-pay

## 2021-06-13 ENCOUNTER — Other Ambulatory Visit (INDEPENDENT_AMBULATORY_CARE_PROVIDER_SITE_OTHER): Payer: Self-pay

## 2021-06-13 ENCOUNTER — Encounter (INDEPENDENT_AMBULATORY_CARE_PROVIDER_SITE_OTHER): Payer: Self-pay

## 2021-06-13 DIAGNOSIS — Z01812 Encounter for preprocedural laboratory examination: Secondary | ICD-10-CM

## 2021-06-13 MED ORDER — CLENPIQ 10-3.5-12 MG-GM -GM/160ML PO SOLN: 1.0000 | Freq: Once | ORAL | 0 refills | Status: AC

## 2021-06-13 NOTE — Telephone Encounter (Signed)
Diamond Le, CMA  

## 2021-06-13 NOTE — Telephone Encounter (Signed)
Referring MD/PCP: Moshe Cipro  Procedure: Tcs  Reason/Indication:  Screening  Has patient had this procedure before?  no  If so, when, by whom and where?    Is there a family history of colon cancer?  no  Who?  What age when diagnosed?    Is patient diabetic? If yes, Type 1 or Type 2   no      Does patient have prosthetic heart valve or mechanical valve?  no  Do you have a pacemaker/defibrillator?  no  Has patient ever had endocarditis/atrial fibrillation? no  Does patient use oxygen? no  Has patient had joint replacement within last 12 months?  no  Is patient constipated or do they take laxatives? no  Does patient have a history of alcohol/drug use?  no  Have you had a stroke/heart attack last 6 mths? no  Do you take medicine for weight loss?  no  For female patients,: do you still have your menstrual cycle? yes  Is patient on blood thinner such as Coumadin, Plavix and/or Aspirin? no  Medications: amlodipine 10 mg daily, lisinopril 5 mg daily, spironnolactone 50 mg daily, dicyclomine 10 mg tid, gas x prn, vit d daily, iorn 325 mg daily  Allergies: nkda  Medication Adjustment per Dr Jenetta Downer [facility] [place]  Procedure date & time: 06/27/21 AT 9:50

## 2021-06-13 NOTE — Telephone Encounter (Signed)
Ok to schedule.  Thanks,  Coda Filler Castaneda Mayorga, MD Gastroenterology and Hepatology [facility]  

## 2021-06-21 ENCOUNTER — Other Ambulatory Visit (HOSPITAL_COMMUNITY)
Admission: RE | Admit: 2021-06-21 | Discharge: 2021-06-21 | Disposition: A | Discharge status: Home / Self Care | Payer: Commercial Managed Care - PPO | Source: Ambulatory Visit | Attending: Gastroenterology | Admitting: Gastroenterology

## 2021-06-21 ENCOUNTER — Other Ambulatory Visit: Payer: Self-pay

## 2021-06-21 DIAGNOSIS — Z01812 Encounter for preprocedural laboratory examination: Secondary | ICD-10-CM | POA: Diagnosis present

## 2021-06-21 DIAGNOSIS — Z1211 Encounter for screening for malignant neoplasm of colon: Secondary | ICD-10-CM | POA: Diagnosis not present

## 2021-06-21 LAB — BASIC METABOLIC PANEL
Anion gap: 7 (ref 5–15)
BUN: 15 mg/dL (ref 6–20)
CO2: 26 mmol/L (ref 22–32)
Calcium: 9.1 mg/dL (ref 8.9–10.3)
Chloride: 103 mmol/L (ref 98–111)
Creatinine, Ser: 1 mg/dL (ref 0.44–1.00)
GFR, Estimated: >60 mL/min (ref >60)
Glucose, Bld: 122 mg/dL — ABNORMAL HIGH (ref 70–99)
Potassium: 3.6 mmol/L (ref 3.5–5.1)
Sodium: 136 mmol/L (ref 135–145)

## 2021-06-21 LAB — PREGNANCY, URINE: Preg Test, Ur: NEGATIVE

## 2021-06-22 NOTE — Patient Instructions (Signed)
Diamond Le  06/22/2021     '@PREFPERIOPPHARMACY'$ @   Your procedure is scheduled on  06/27/2021.   Report to [facility] at  0800 A.M.   Call this number if you have problems the morning of surgery:  [phone]   Remember:  Follow the diet and prep instructions given to you by the office.    Take these medicines the morning of surgery with A SIP OF WATER                         amlodipine,Protonix.     Do not wear jewelry, make-up or nail polish.  Do not wear lotions, powders, or perfumes, or deodorant.  Do not shave 48 hours prior to surgery.  Men may shave face and neck.  Do not bring valuables to the hospital.  [facility] is not responsible for any belongings or valuables.  Contacts, dentures or bridgework may not be worn into surgery.  Leave your suitcase in the car.  After surgery it may be brought to your room.  For patients admitted to the hospital, discharge time will be determined by your treatment team.  Patients discharged the day of surgery will not be allowed to drive home and must have someone with them for 24 hours.    Special instructions:   DO NOT smoke tobacco or vape for 24 hours before your procedure.  Please read over the following fact sheets that you were given. Anesthesia Post-op Instructions and Care and Recovery After Surgery      Colonoscopy, Adult, Care After This sheet gives you information about how to care for yourself after your procedure. Your health care provider may also give you more specific instructions. If you have problems or questions, contact your health careprovider. What can I expect after the procedure? After the procedure, it is common to have: A small amount of blood in your stool for 24 hours after the procedure. Some gas. Mild cramping or bloating of your abdomen. Follow these instructions at home: Eating and drinking  Drink enough fluid to keep your urine pale yellow. Follow instructions from your  health care provider about eating or drinking restrictions. Resume your normal diet as instructed by your health care provider. Avoid heavy or fried foods that are hard to digest.  Activity Rest as told by your health care provider. Avoid sitting for a long time without moving. Get up to take short walks every 1-2 hours. This is important to improve blood flow and breathing. Ask for help if you feel weak or unsteady. Return to your normal activities as told by your health care provider. Ask your health care provider what activities are safe for you. Managing cramping and bloating  Try walking around when you have cramps or feel bloated. Apply heat to your abdomen as told by your health care provider. Use the heat source that your health care provider recommends, such as a moist heat pack or a heating pad. Place a towel between your skin and the heat source. Leave the heat on for 20-30 minutes. Remove the heat if your skin turns bright red. This is especially important if you are unable to feel pain, heat, or cold. You may have a greater risk of getting burned.  General instructions If you were given a sedative during the procedure, it can affect you for several hours. Do not drive or operate machinery until your health care provider says that  it is safe. For the first 24 hours after the procedure: Do not sign important documents. Do not drink alcohol. Do your regular daily activities at a slower pace than normal. Eat soft foods that are easy to digest. Take over-the-counter and prescription medicines only as told by your health care provider. Keep all follow-up visits as told by your health care provider. This is important. Contact a health care provider if: You have blood in your stool 2-3 days after the procedure. Get help right away if you have: More than a small spotting of blood in your stool. Large blood clots in your stool. Swelling of your abdomen. Nausea or vomiting. A  fever. Increasing pain in your abdomen that is not relieved with medicine. Summary After the procedure, it is common to have a small amount of blood in your stool. You may also have mild cramping and bloating of your abdomen. If you were given a sedative during the procedure, it can affect you for several hours. Do not drive or operate machinery until your health care provider says that it is safe. Get help right away if you have a lot of blood in your stool, nausea or vomiting, a fever, or increased pain in your abdomen. This information is not intended to replace advice given to you by your health care provider. Make sure you discuss any questions you have with your healthcare provider. Document Revised: 11/06/2019 Document Reviewed: 06/08/2019 Elsevier Patient Education  [place] After This sheet gives you information about how to care for yourself after your procedure. Your health care provider may also give you more specific instructions. If you have problems or questions, contact your health careprovider. What can I expect after the procedure? After the procedure, it is common to have: Tiredness. Forgetfulness about what happened after the procedure. Impaired judgment for important decisions. Nausea or vomiting. Some difficulty with balance. Follow these instructions at home: For the time period you were told by your health care provider:     Rest as needed. Do not participate in activities where you could fall or become injured. Do not drive or use machinery. Do not drink alcohol. Do not take sleeping pills or medicines that cause drowsiness. Do not make important decisions or sign legal documents. Do not take care of children on your own. Eating and drinking Follow the diet that is recommended by your health care provider. Drink enough fluid to keep your urine pale yellow. If you vomit: Drink water, juice, or soup when you can drink  without vomiting. Make sure you have little or no nausea before eating solid foods. General instructions Have a responsible adult stay with you for the time you are told. It is important to have someone help care for you until you are awake and alert. Take over-the-counter and prescription medicines only as told by your health care provider. If you have sleep apnea, surgery and certain medicines can increase your risk for breathing problems. Follow instructions from your health care provider about wearing your sleep device: Anytime you are sleeping, including during daytime naps. While taking prescription pain medicines, sleeping medicines, or medicines that make you drowsy. Avoid smoking. Keep all follow-up visits as told by your health care provider. This is important. Contact a health care provider if: You keep feeling nauseous or you keep vomiting. You feel light-headed. You are still sleepy or having trouble with balance after 24 hours. You develop a rash. You have a fever. You have  redness or swelling around the IV site. Get help right away if: You have trouble breathing. You have new-onset confusion at home. Summary For several hours after your procedure, you may feel tired. You may also be forgetful and have poor judgment. Have a responsible adult stay with you for the time you are told. It is important to have someone help care for you until you are awake and alert. Rest as told. Do not drive or operate machinery. Do not drink alcohol or take sleeping pills. Get help right away if you have trouble breathing, or if you suddenly become confused. This information is not intended to replace advice given to you by your health care provider. Make sure you discuss any questions you have with your healthcare provider. Document Revised: 07/28/2020 Document Reviewed: 10/15/2019 Elsevier Patient Education  2022 [employer].

## 2021-06-23 ENCOUNTER — Encounter (HOSPITAL_COMMUNITY): Payer: Self-pay

## 2021-06-23 ENCOUNTER — Encounter (HOSPITAL_COMMUNITY)
Admission: RE | Admit: 2021-06-23 | Discharge: 2021-06-23 | Disposition: A | Discharge status: Home / Self Care | Payer: Commercial Managed Care - PPO | Source: Ambulatory Visit | Attending: Gastroenterology | Admitting: Gastroenterology

## 2021-06-27 ENCOUNTER — Encounter (HOSPITAL_COMMUNITY)
Admission: RE | Disposition: A | Discharge status: Home / Self Care | Payer: Self-pay | Source: Home / Self Care | Attending: Gastroenterology

## 2021-06-27 ENCOUNTER — Ambulatory Visit (HOSPITAL_COMMUNITY)
Admission: RE | Admit: 2021-06-27 | Discharge: 2021-06-27 | Disposition: A | Discharge status: Home / Self Care | Payer: Commercial Managed Care - PPO | Attending: Gastroenterology | Admitting: Gastroenterology

## 2021-06-27 ENCOUNTER — Encounter (HOSPITAL_COMMUNITY): Payer: Self-pay | Admitting: Gastroenterology

## 2021-06-27 ENCOUNTER — Ambulatory Visit (HOSPITAL_COMMUNITY): Payer: Commercial Managed Care - PPO | Admitting: Anesthesiology

## 2021-06-27 DIAGNOSIS — Z825 Family history of asthma and other chronic lower respiratory diseases: Secondary | ICD-10-CM | POA: Diagnosis not present

## 2021-06-27 DIAGNOSIS — Z8041 Family history of malignant neoplasm of ovary: Secondary | ICD-10-CM | POA: Insufficient documentation

## 2021-06-27 DIAGNOSIS — Z801 Family history of malignant neoplasm of trachea, bronchus and lung: Secondary | ICD-10-CM | POA: Diagnosis not present

## 2021-06-27 DIAGNOSIS — Z8249 Family history of ischemic heart disease and other diseases of the circulatory system: Secondary | ICD-10-CM | POA: Diagnosis not present

## 2021-06-27 DIAGNOSIS — Z6834 Body mass index (BMI) 34.0-34.9, adult: Secondary | ICD-10-CM | POA: Diagnosis not present

## 2021-06-27 DIAGNOSIS — Z79899 Other long term (current) drug therapy: Secondary | ICD-10-CM | POA: Diagnosis not present

## 2021-06-27 DIAGNOSIS — Z808 Family history of malignant neoplasm of other organs or systems: Secondary | ICD-10-CM | POA: Diagnosis not present

## 2021-06-27 DIAGNOSIS — D123 Benign neoplasm of transverse colon: Secondary | ICD-10-CM | POA: Diagnosis not present

## 2021-06-27 DIAGNOSIS — D120 Benign neoplasm of cecum: Secondary | ICD-10-CM | POA: Diagnosis not present

## 2021-06-27 DIAGNOSIS — K219 Gastro-esophageal reflux disease without esophagitis: Secondary | ICD-10-CM | POA: Diagnosis not present

## 2021-06-27 DIAGNOSIS — Z1211 Encounter for screening for malignant neoplasm of colon: Secondary | ICD-10-CM | POA: Insufficient documentation

## 2021-06-27 DIAGNOSIS — D122 Benign neoplasm of ascending colon: Secondary | ICD-10-CM | POA: Insufficient documentation

## 2021-06-27 DIAGNOSIS — E669 Obesity, unspecified: Secondary | ICD-10-CM | POA: Diagnosis not present

## 2021-06-27 HISTORY — PX: COLONOSCOPY WITH PROPOFOL: SHX5780

## 2021-06-27 HISTORY — PX: POLYPECTOMY: SHX5525

## 2021-06-27 LAB — HM COLONOSCOPY

## 2021-06-27 SURGERY — COLONOSCOPY WITH PROPOFOL
Anesthesia: General

## 2021-06-27 MED ORDER — PROPOFOL 10 MG/ML IV BOLUS
INTRAVENOUS | Status: DC | PRN
  Administered 2021-06-27: 20 mg via INTRAVENOUS
  Administered 2021-06-27: 50 mg via INTRAVENOUS

## 2021-06-27 MED ORDER — PROPOFOL 500 MG/50ML IV EMUL
INTRAVENOUS | Status: DC | PRN
  Administered 2021-06-27: 150 ug/kg/min via INTRAVENOUS

## 2021-06-27 MED ORDER — LACTATED RINGERS IV SOLN
INTRAVENOUS | Status: DC
  Administered 2021-06-27: 1000 mL via INTRAVENOUS

## 2021-06-27 NOTE — H&P (Signed)
Diamond Le is an 55 y.o. female.   Chief Complaint: colorectal cancer screening HPI: 55 year old female with past medical history of GERD, hypertension and obesity, coming for screening colonoscopy. The patient has never had a colonoscopy in the past.  The patient denies having any complaints such as melena, hematochezia, abdominal pain or distention, change in her bowel movement consistency or frequency, no changes in her weight recently.  No family history of colorectal cancer.   Past Medical History:  Diagnosis Date   Anemia    heavy flow   Folliculitis [id number]   GERD (gastroesophageal reflux disease)    Hypertension    Macromastia    Obesity    Palpitation    Superficial fungus infection of skin 04/19/2015   Vitamin D deficiency     Past Surgical History:  Procedure Laterality Date   BREAST CYST EXCISION Right 12/14/2016   BREAST REDUCTION SURGERY Bilateral 02/21/2021   Procedure: MAMMARY REDUCTION  (BREAST);  Surgeon: [name], MD;  Location: [facility];  Service: Plastics;  Laterality: Bilateral;   MASS EXCISION Right 12/14/2016   Procedure: EXCISION CYST RIGHT AXILLA;  Surgeon: Aviva Signs, MD;  Location: [facility];  Service: General;  Laterality: Right;    Family History  Problem Relation Age of Onset   Asthma Mother        most of her life    Cancer Father        lung   Cancer Paternal Aunt        cervical    Hypertension Maternal Grandmother    Ovarian cancer Other    Ovarian cancer Niece    Social History:  reports that she has never smoked. She has never used smokeless tobacco. She reports that she does not drink alcohol and does not use drugs.  Allergies: No Known Allergies  Medications Prior to Admission  Medication Sig Dispense Refill   amLODipine (NORVASC) 10 MG tablet TAKE 1 TABLET(10 MG) BY MOUTH DAILY (Patient taking differently: Take 10 mg by mouth daily.) 30 tablet 5   ergocalciferol (VITAMIN D2) 1.25 MG (50000 UT) capsule  Take 1 capsule (50,000 Units total) by mouth once a week. One capsule once weekly (Patient taking differently: Take 50,000 Units by mouth every Wednesday.) 12 capsule 2   lisinopril (ZESTRIL) 5 MG tablet Take 1 tablet (5 mg total) by mouth daily. 90 tablet 0   pantoprazole (PROTONIX) 20 MG tablet Take 1 tablet (20 mg total) by mouth daily. 90 tablet 1   spironolactone (ALDACTONE) 50 MG tablet TAKE 1 TABLET(50 MG) BY MOUTH DAILY (Patient taking differently: Take 50 mg by mouth daily.) 30 tablet 4    No results found for this or any previous visit (from the past 48 hour(s)). No results found.  Review of Systems  Constitutional: Negative.   HENT: Negative.    Eyes: Negative.   Respiratory: Negative.    Cardiovascular: Negative.   Gastrointestinal: Negative.   Endocrine: Negative.   Genitourinary: Negative.   Musculoskeletal: Negative.   Skin: Negative.   Allergic/Immunologic: Negative.   Neurological: Negative.   Hematological: Negative.   Psychiatric/Behavioral: Negative.     Blood pressure (!) 141/85, temperature 98.2 F (36.8 C), temperature source Oral, resp. rate 13, height '5\' 5"'$  (1.651 m), weight 95.3 kg, SpO2 94 %. Physical Exam  GENERAL: The patient is AO x3, in no acute distress. HEENT: Head is normocephalic and atraumatic. EOMI are intact. Mouth is well hydrated and without lesions. NECK: Supple. No masses  LUNGS: Clear to auscultation. No presence of rhonchi/wheezing/rales. Adequate chest expansion HEART: RRR, normal s1 and s2. ABDOMEN: Soft, nontender, no guarding, no peritoneal signs, and nondistended. BS +. No masses. EXTREMITIES: Without any cyanosis, clubbing, rash, lesions or edema. NEUROLOGIC: AOx3, no focal motor deficit. SKIN: no jaundice, no rashes  Assessment/Plan 55 year old female with past medical history of GERD, hypertension and obesity, coming for screening colonoscopy. The patient is at average risk for colorectal cancer.  We will proceed with  colonoscopy today.   Harvel Quale, MD 06/27/2021, 10:08 AM

## 2021-06-27 NOTE — Op Note (Signed)
[facility] Patient Name: Diamond Le Procedure Date: 06/27/2021 10:09 AM MRN: [id number] Date of Birth: August 18, 1966 Attending MD: Maylon Peppers ,  CSN: [id number] Age: 55 Admit Type: Outpatient Procedure:                Colonoscopy Indications:              Screening for colorectal malignant neoplasm Providers:                Maylon Peppers, [place] Sharon Seller, RN, Nelma Rothman, Technician Referring MD:              Medicines:                Monitored Anesthesia Care Complications:            No immediate complications. Estimated Blood Loss:     Estimated blood loss: none. Procedure:                Pre-Anesthesia Assessment:                           - Prior to the procedure, a History and Physical                            was performed, and patient medications, allergies                            and sensitivities were reviewed. The patient's                            tolerance of previous anesthesia was reviewed.                           - The risks and benefits of the procedure and the                            sedation options and risks were discussed with the                            patient. All questions were answered and informed                            consent was obtained.                           - ASA Grade Assessment: I - A normal, healthy                            patient.                           After obtaining informed consent, the colonoscope                            was passed under direct vision. Throughout the  procedure, the patient's blood pressure, pulse, and                            oxygen saturations were monitored continuously. The                            [id number]) scope was introduced through                            the anus and advanced to the the cecum, identified                            by appendiceal orifice and ileocecal valve. The                             colonoscopy was performed without difficulty. The                            patient tolerated the procedure well. The quality                            of the bowel preparation was excellent. Scope In: 10:33:32 AM Scope Out: 10:55:21 AM Scope Withdrawal Time: 0 hours 18 minutes 41 seconds  Total Procedure Duration: 0 hours 21 minutes 49 seconds  Findings:      The perianal and digital rectal examinations were normal.      Two sessile polyps were found in the cecum. The polyps were 1 to 2 mm in       size. These polyps were removed with a cold biopsy forceps. Resection       and retrieval were complete.      Three sessile polyps were found in the transverse colon and ascending       colon. The polyps were 3 to 8 mm in size. These polyps were removed with       a cold snare. Resection and retrieval were complete.      The retroflexed view of the distal rectum and anal verge was normal and       showed no anal or rectal abnormalities. Impression:               - Two 1 to 2 mm polyps in the cecum, removed with a                            cold biopsy forceps. Resected and retrieved.                           - Three 3 to 8 mm polyps in the transverse colon                            and in the ascending colon, removed with a cold                            snare. Resected and retrieved.                           -  The distal rectum and anal verge are normal on                            retroflexion view. Moderate Sedation:      Per Anesthesia Care Recommendation:           - Discharge patient to home (ambulatory).                           - Resume previous diet.                           - Await pathology results.                           - Repeat colonoscopy for surveillance based on                            pathology results. Procedure Code(s):        --- Professional ---                           [phone], Colonoscopy, flexible; with removal of                             tumor(s), polyp(s), or other lesion(s) by snare                            technique                           45380, 3, Colonoscopy, flexible; with biopsy,                            single or multiple Diagnosis Code(s):        --- Professional ---                           Z12.11, Encounter for screening for malignant                            neoplasm of colon                           K63.5, Polyp of colon CPT copyright 2019 American Medical Association. All rights reserved. The codes documented in this report are preliminary and upon coder review may  be revised to meet current compliance requirements. Maylon Peppers, MD Maylon Peppers,  06/27/2021 11:07:32 AM This report has been signed electronically. Number of Addenda: 0

## 2021-06-27 NOTE — Anesthesia Postprocedure Evaluation (Signed)
Anesthesia Post Note  Patient: Diamond Le  Procedure(s) Performed: COLONOSCOPY WITH PROPOFOL POLYPECTOMY  Patient location during evaluation: Short Stay Anesthesia Type: General Level of consciousness: awake and alert Pain management: pain level controlled Vital Signs Assessment: post-procedure vital signs reviewed and stable Respiratory status: spontaneous breathing Cardiovascular status: blood pressure returned to baseline and stable Postop Assessment: no apparent nausea or vomiting Anesthetic complications: no   No notable events documented.   Last Vitals:  Vitals:   06/27/21 0903  BP: (!) 141/85  Resp: 13  Temp: 36.8 C  SpO2: 94%    Last Pain:  Vitals:   06/27/21 1016  TempSrc:   PainSc: 0-No pain                 Dameshia Seybold

## 2021-06-27 NOTE — Discharge Instructions (Signed)
You are being discharged to home.  Resume your previous diet.  We are waiting for your pathology results.  Your physician has recommended a repeat colonoscopy for surveillance based on pathology results.  

## 2021-06-27 NOTE — Anesthesia Preprocedure Evaluation (Signed)
Anesthesia Evaluation  Patient identified by MRN, date of birth, ID band Patient awake    Reviewed: Allergy & Precautions, NPO status , Patient's Chart, lab work & pertinent test results  History of Anesthesia Complications Negative for: history of anesthetic complications  Airway Mallampati: II  TM Distance: >3 FB Neck ROM: Full    Dental  (+) Dental Advisory Given, Missing   Pulmonary neg pulmonary ROS,    Pulmonary exam normal breath sounds clear to auscultation       Cardiovascular Exercise Tolerance: Good hypertension, Pt. on medications Normal cardiovascular exam Rhythm:Regular Rate:Normal     Neuro/Psych negative neurological ROS  negative psych ROS   GI/Hepatic Neg liver ROS, GERD  Medicated and Controlled,  Endo/Other  negative endocrine ROS  Renal/GU negative Renal ROS     Musculoskeletal negative musculoskeletal ROS (+)   Abdominal   Peds  Hematology  (+) anemia ,   Anesthesia Other Findings   Reproductive/Obstetrics negative OB ROS                           Anesthesia Physical Anesthesia Plan  ASA: 2  Anesthesia Plan: General   Post-op Pain Management:    Induction: Intravenous  PONV Risk Score and Plan: Propofol infusion  Airway Management Planned: Nasal Cannula and Natural Airway  Additional Equipment:   Intra-op Plan:   Post-operative Plan:   Informed Consent: I have reviewed the patients History and Physical, chart, labs and discussed the procedure including the risks, benefits and alternatives for the proposed anesthesia with the patient or authorized representative who has indicated his/her understanding and acceptance.     Dental advisory given  Plan Discussed with: CRNA and Surgeon  Anesthesia Plan Comments:         Anesthesia Quick Evaluation

## 2021-06-27 NOTE — Transfer of Care (Signed)
Immediate Anesthesia Transfer of Care Note  Patient: Diamond Le  Procedure(s) Performed: COLONOSCOPY WITH PROPOFOL POLYPECTOMY  Patient Location: Short Stay  Anesthesia Type:General  Level of Consciousness: awake  Airway & Oxygen Therapy: Patient Spontanous Breathing  Post-op Assessment: Report given to RN  Post vital signs: Reviewed  Last Vitals:  Vitals Value Taken Time  BP    Temp    Pulse    Resp    SpO2      Last Pain:  Vitals:   06/27/21 1016  TempSrc:   PainSc: 0-No pain      Patients Stated Pain Goal: 8 ([id number])  Complications: No notable events documented.

## 2021-06-28 LAB — SURGICAL PATHOLOGY

## 2021-06-30 ENCOUNTER — Encounter (INDEPENDENT_AMBULATORY_CARE_PROVIDER_SITE_OTHER): Payer: Self-pay | Admitting: *Deleted

## 2021-07-06 ENCOUNTER — Encounter (HOSPITAL_COMMUNITY): Payer: Self-pay | Admitting: Gastroenterology

## 2021-07-09 ENCOUNTER — Other Ambulatory Visit: Payer: Self-pay | Admitting: Student

## 2021-07-10 ENCOUNTER — Other Ambulatory Visit: Payer: Self-pay | Admitting: Student

## 2021-10-04 ENCOUNTER — Ambulatory Visit (INDEPENDENT_AMBULATORY_CARE_PROVIDER_SITE_OTHER): Payer: Commercial Managed Care - PPO | Admitting: Family Medicine

## 2021-10-04 ENCOUNTER — Encounter: Payer: Self-pay | Admitting: Family Medicine

## 2021-10-04 VITALS — BP 129/76 | HR 86 | Resp 17 | Ht 65.0 in | Wt 233.0 lb

## 2021-10-04 DIAGNOSIS — Z23 Encounter for immunization: Secondary | ICD-10-CM

## 2021-10-04 DIAGNOSIS — M7989 Other specified soft tissue disorders: Secondary | ICD-10-CM | POA: Diagnosis not present

## 2021-10-04 DIAGNOSIS — E559 Vitamin D deficiency, unspecified: Secondary | ICD-10-CM

## 2021-10-04 DIAGNOSIS — Z124 Encounter for screening for malignant neoplasm of cervix: Secondary | ICD-10-CM

## 2021-10-04 DIAGNOSIS — I10 Essential (primary) hypertension: Secondary | ICD-10-CM

## 2021-10-04 DIAGNOSIS — N838 Other noninflammatory disorders of ovary, fallopian tube and broad ligament: Secondary | ICD-10-CM

## 2021-10-04 MED ORDER — CLOTRIMAZOLE-BETAMETHASONE 1-0.05 % EX CREA: 1.0000 "application " | TOPICAL_CREAM | Freq: Two times a day (BID) | CUTANEOUS | 1 refills | Status: AC

## 2021-10-04 NOTE — Assessment & Plan Note (Addendum)
4 month h/o left calf swelling, flares up with pain intermittently, imaging needed, 3 times per week

## 2021-10-04 NOTE — Patient Instructions (Addendum)
  F/U  in 10 weeks, call if you need me before, shingrix #2 at visit  Nurse please order and schedule [place] of left  calf  eval chronic swelling ( R/o DVT)  Shingrix #1 today  Need to limit calories to 1200 to 1500 per day  I will contact you after labs are reviewed r e weight loss med  Goal of 6 to 10 pound weight los  It is important that you exercise regularly at least 30 minutes 5 times a week. If you develop chest pain, have severe difficulty breathing, or feel very tired, stop exercising immediately and seek medical attention     CBC, lipid, cmp and EGFr, HBA1C and vitD this week ( fasting )  Please refer to nutrition for education re obesity, and give 1200 to 1`500 cal diet( if covered in person, if not give pt link through her ins)

## 2021-10-04 NOTE — Progress Notes (Signed)
I called pt with appt for [place] left a voicemail message.

## 2021-10-07 ENCOUNTER — Encounter: Payer: Self-pay | Admitting: Family Medicine

## 2021-10-07 LAB — CBC
Hematocrit: 36.2 % (ref 34.0–46.6)
Hemoglobin: 11.5 g/dL (ref 11.1–15.9)
MCH: 26.5 pg — ABNORMAL LOW (ref 26.6–33.0)
MCHC: 31.8 g/dL (ref 31.5–35.7)
MCV: 83 fL (ref 79–97)
Platelets: 309 10*3/uL (ref 150–450)
RBC: 4.34 x10E6/uL (ref 3.77–5.28)
RDW: 12.4 % (ref 11.7–15.4)
WBC: 6.5 10*3/uL (ref 3.4–10.8)

## 2021-10-07 LAB — LIPID PANEL
Chol/HDL Ratio: 3.5 ratio (ref 0.0–4.4)
Cholesterol, Total: 181 mg/dL (ref 100–199)
HDL: 52 mg/dL (ref >39)
LDL Chol Calc (NIH): 115 mg/dL — ABNORMAL HIGH (ref 0–99)
Triglycerides: 74 mg/dL (ref 0–149)
VLDL Cholesterol Cal: 14 mg/dL (ref 5–40)

## 2021-10-07 LAB — CMP14+EGFR
ALT: 18 IU/L (ref 0–32)
AST: 20 IU/L (ref 0–40)
Albumin/Globulin Ratio: 1.8 (ref 1.2–2.2)
Albumin: 4.6 g/dL (ref 3.8–4.9)
Alkaline Phosphatase: 106 IU/L (ref 44–121)
BUN/Creatinine Ratio: 9 (ref 9–23)
BUN: 9 mg/dL (ref 6–24)
Bilirubin Total: 0.4 mg/dL (ref 0.0–1.2)
CO2: 28 mmol/L (ref 20–29)
Calcium: 9.7 mg/dL (ref 8.7–10.2)
Chloride: 104 mmol/L (ref 96–106)
Creatinine, Ser: 0.98 mg/dL (ref 0.57–1.00)
Globulin, Total: 2.5 g/dL (ref 1.5–4.5)
Glucose: 89 mg/dL (ref 70–99)
Potassium: 3.9 mmol/L (ref 3.5–5.2)
Sodium: 144 mmol/L (ref 134–144)
Total Protein: 7.1 g/dL (ref 6.0–8.5)
eGFR: 69 mL/min/{1.73_m2} (ref >59)

## 2021-10-07 LAB — HEMOGLOBIN A1C
Est. average glucose Bld gHb Est-mCnc: 111 mg/dL
Hgb A1c MFr Bld: 5.5 % (ref 4.8–5.6)

## 2021-10-07 LAB — VITAMIN D 25 HYDROXY (VIT D DEFICIENCY, FRACTURES): Vit D, 25-Hydroxy: 41.1 ng/mL (ref 30.0–100.0)

## 2021-10-07 MED ORDER — PHENTERMINE HCL 37.5 MG PO TABS: ORAL_TABLET | ORAL | 2 refills | Status: DC

## 2021-10-07 NOTE — Assessment & Plan Note (Signed)
  Patient re-educated about  the importance of commitment to a  minimum of 150 minutes of exercise per week as able.  The importance of healthy food choices with portion control discussed, as well as eating regularly and within a 12 hour window most days. The need to choose "clean , green" food 50 to 75% of the time is discussed, as well as to make water the primary drink and set a goal of 64 ounces water daily.    Weight /BMI 10/04/2021 06/27/2021 06/08/2021  WEIGHT 233 lb 0.6 oz 210 lb 231 lb 1.9 oz  HEIGHT 5\' 5"  5\' 5"  5\' 5"   BMI 38.78 kg/m2 34.95 kg/m2 38.46 kg/m2  start half phentermine daily

## 2021-10-07 NOTE — Assessment & Plan Note (Signed)
Needs f/u with gyne also pap due I n Jan 2023, will refer

## 2021-10-07 NOTE — Assessment & Plan Note (Signed)
Controlled, no change in medication DASH diet and commitment to daily physical activity for a minimum of 30 minutes discussed and encouraged, as a part of hypertension management. The importance of attaining a healthy weight is also discussed.  BP/Weight 10/04/2021 06/27/2021 06/08/2021 03/17/2021 03/02/2021 02/21/2021 6/33/3545  Systolic BP 625 638 937 342 876 811 572  Diastolic BP 76 58 84 73 84 77 65  Wt. (Lbs) 233.04 210 231.12 - - 230.6 233.5  BMI 38.78 34.95 38.46 - - 38.37 38.86

## 2021-10-07 NOTE — Progress Notes (Signed)
   Diamond Le     MRN: [id number]      DOB: 1966/11/25   HPI [name] is here for follow up and re-evaluation of chronic medical conditions, medication management and review of any available recent lab and radiology data.  Preventive health is updated, specifically  Cancer screening and Immunization.   C/o left leg swelling, denies dyspnea or hemoptysis The PT denies any adverse reactions to current medications since the last visit.  C/o weight gain wants help   ROS Denies recent fever or chills. Denies sinus pressure, nasal congestion, ear pain or sore throat. Denies chest congestion, productive cough or wheezing. Denies chest pains, palpitations PND or orthopnea Denies abdominal pain, nausea, vomiting,diarrhea or constipation.   Denies dysuria, frequency, hesitancy or incontinence. Denies joint pain, swelling and limitation in mobility. Denies headaches, seizures, numbness, or tingling. Denies depression, anxiety or insomnia. Denies skin break down or rash.   PE  BP 129/76   Pulse 86   Resp 17   Ht 5\' 5"  (1.651 m)   Wt 233 lb 0.6 oz (105.7 kg)   SpO2 97%   BMI 38.78 kg/m   Patient alert and oriented and in no cardiopulmonary distress.  HEENT: No facial asymmetry, EOMI,     Neck supple .  Chest: Clear to auscultation bilaterally.  CVS: S1, S2 no murmurs, no S3.Regular rate.  ABD: Soft non tender.   Ext: No edema. Left calf is 2 inches bigger than right  MS: Adequate ROM spine, shoulders, hips and knees.  Skin: Intact, no ulcerations or rash noted.  Psych: Good eye contact, normal affect. Memory intact not anxious or depressed appearing.  CNS: CN 2-12 intact, power,  normal throughout.no focal deficits noted.   Assessment & Plan  Left leg swelling 4 month h/o left calf swelling, flares up with pain intermittently, imaging needed, 3 times per week  Essential hypertension Controlled, no change in medication DASH diet and commitment to daily physical  activity for a minimum of 30 minutes discussed and encouraged, as a part of hypertension management. The importance of attaining a healthy weight is also discussed.  BP/Weight 10/04/2021 06/27/2021 06/08/2021 03/17/2021 03/02/2021 02/21/2021 5/53/7482  Systolic BP 707 867 544 920 100 712 197  Diastolic BP 76 58 84 73 84 77 65  Wt. (Lbs) 233.04 210 231.12 - - 230.6 233.5  BMI 38.78 34.95 38.46 - - 38.37 38.86       Obesity (BMI 30-39.9)  Patient re-educated about  the importance of commitment to a  minimum of 150 minutes of exercise per week as able.  The importance of healthy food choices with portion control discussed, as well as eating regularly and within a 12 hour window most days. The need to choose "clean , green" food 50 to 75% of the time is discussed, as well as to make water the primary drink and set a goal of 64 ounces water daily.    Weight /BMI 10/04/2021 06/27/2021 06/08/2021  WEIGHT 233 lb 0.6 oz 210 lb 231 lb 1.9 oz  HEIGHT 5\' 5"  5\' 5"  5\' 5"   BMI 38.78 kg/m2 34.95 kg/m2 38.46 kg/m2  start half phentermine daily    Ovarian mass, left Needs f/u with gyne also pap due I n Jan 2023, will refer

## 2021-10-10 ENCOUNTER — Other Ambulatory Visit: Payer: Self-pay | Admitting: Family Medicine

## 2021-10-10 ENCOUNTER — Ambulatory Visit (HOSPITAL_COMMUNITY)
Admission: RE | Admit: 2021-10-10 | Discharge: 2021-10-10 | Disposition: A | Discharge status: Home / Self Care | Payer: Commercial Managed Care - PPO | Source: Ambulatory Visit | Attending: Family Medicine | Admitting: Family Medicine

## 2021-10-10 ENCOUNTER — Other Ambulatory Visit: Payer: Self-pay

## 2021-10-10 DIAGNOSIS — M7989 Other specified soft tissue disorders: Secondary | ICD-10-CM | POA: Insufficient documentation

## 2021-10-11 ENCOUNTER — Other Ambulatory Visit: Payer: Self-pay | Admitting: Family Medicine

## 2021-10-11 ENCOUNTER — Ambulatory Visit: Payer: Commercial Managed Care - PPO | Admitting: Family Medicine

## 2021-10-11 DIAGNOSIS — M7989 Other specified soft tissue disorders: Secondary | ICD-10-CM

## 2021-10-16 ENCOUNTER — Other Ambulatory Visit: Payer: Self-pay | Admitting: Student

## 2021-10-24 ENCOUNTER — Telehealth: Payer: Self-pay

## 2021-10-24 NOTE — Telephone Encounter (Signed)
this would require an appt to address concern. Thanks

## 2021-10-24 NOTE — Telephone Encounter (Signed)
Appointment scheduled 12/6 with Dr Posey Pronto.

## 2021-10-24 NOTE — Telephone Encounter (Signed)
Patient called requesting an MRI for left leg swelling and slightly discolored on top its darker in color.    Concern leg cancer runs in family.  Call back # [phone].

## 2021-10-30 ENCOUNTER — Other Ambulatory Visit: Payer: Self-pay

## 2021-10-30 DIAGNOSIS — I872 Venous insufficiency (chronic) (peripheral): Secondary | ICD-10-CM

## 2021-10-31 ENCOUNTER — Other Ambulatory Visit: Payer: Self-pay

## 2021-10-31 ENCOUNTER — Encounter: Payer: Self-pay | Admitting: Internal Medicine

## 2021-10-31 ENCOUNTER — Ambulatory Visit (INDEPENDENT_AMBULATORY_CARE_PROVIDER_SITE_OTHER): Payer: Commercial Managed Care - PPO | Admitting: Internal Medicine

## 2021-10-31 VITALS — BP 128/78 | HR 78 | Resp 18 | Ht 65.0 in | Wt 236.0 lb

## 2021-10-31 DIAGNOSIS — M7989 Other specified soft tissue disorders: Secondary | ICD-10-CM | POA: Diagnosis not present

## 2021-10-31 NOTE — Progress Notes (Signed)
Acute Office Visit  Subjective:    Patient ID: Diamond Le, female    DOB: 29-Nov-1965, 55 y.o.   MRN: [id number]  Chief Complaint  Patient presents with   Leg Swelling    Pt started having left leg swelling mid July saw dr simpson swelling getting worse hurts at times would like to know if [place] or MRI needs ordering not warm to touch     HPI Patient is in today for c/o left leg swelling, which is chronic. She points around upper part of shin area and states that her swelling started around there and has been to lower leg. She has to stand long time at her work. She denies any recent prolonged immobilization. Denies any recent injury. She has [place] of LE scheduled and Vascular surgery appointment.  Past Medical History:  Diagnosis Date   Anemia    heavy flow   Folliculitis 9/38/1829   GERD (gastroesophageal reflux disease)    Hypertension    Macromastia    Obesity    Palpitation    Superficial fungus infection of skin 04/19/2015   Vitamin D deficiency     Past Surgical History:  Procedure Laterality Date   BREAST CYST EXCISION Right 12/14/2016   BREAST REDUCTION SURGERY Bilateral 02/21/2021   Procedure: MAMMARY REDUCTION  (BREAST);  Surgeon: [name], MD;  Location: [facility];  Service: Plastics;  Laterality: Bilateral;   COLONOSCOPY WITH PROPOFOL N/A 06/27/2021   Procedure: COLONOSCOPY WITH PROPOFOL;  Surgeon: Harvel Quale, MD;  [facility];  Service: Gastroenterology;  Laterality: N/A;  9:50   MASS EXCISION Right 12/14/2016   Procedure: EXCISION CYST RIGHT AXILLA;  Surgeon: Aviva Signs, MD;  Location: [facility];  Service: General;  Laterality: Right;   POLYPECTOMY  06/27/2021   Procedure: POLYPECTOMY;  Surgeon: Harvel Quale, MD;  [facility];  Service: Gastroenterology;;    Family History  Problem Relation Age of Onset   Asthma Mother        most of her life    Cancer Father        lung   Cancer  Paternal Aunt        cervical    Hypertension Maternal Grandmother    Ovarian cancer Other    Ovarian cancer Niece     Social History   Socioeconomic History   Marital status: Married    Spouse name: Not on file   Number of children: 2   Years of education: Not on file   Highest education level: Not on file  Occupational History   Occupation: nurse at [employer]   Tobacco Use   Smoking status: Never   Smokeless tobacco: Never  Vaping Use   Vaping Use: Never used  Substance and Sexual Activity   Alcohol use: No   Drug use: No   Sexual activity: Yes    Birth control/protection: Condom, None  Other Topics Concern   Not on file  Social History Narrative   Not on file   Social Determinants of Health   Financial Resource Strain: Low Risk    Difficulty of Paying Living Expenses: Not hard at all  Food Insecurity: No Food Insecurity   Worried About Charity fundraiser in the Last Year: Never true   [place] in the Last Year: Never true  Transportation Needs: No Transportation Needs   Lack of Transportation (Medical): No   Lack of Transportation (Non-Medical): No  Physical Activity:  Insufficiently Active   Days of Exercise per Week: 2 days   Minutes of Exercise per Session: 30 min  Stress: No Stress Concern Present   Feeling of Stress : Not at all  Social Connections: Socially Integrated   Frequency of Communication with Friends and Family: More than three times a week   Frequency of Social Gatherings with Friends and Family: Once a week   Attends Religious Services: 1 to 4 times per year   Active Member of [employer] or Organizations: No   Attends Music therapist: 1 to 4 times per year   Marital Status: Married  Human resources officer Violence: Not At Risk   Fear of Current or Ex-Partner: No   Emotionally Abused: No   Physically Abused: No   Sexually Abused: No    Outpatient Medications Prior to Visit  Medication Sig Dispense Refill   amLODipine (NORVASC)  10 MG tablet TAKE 1 TABLET(10 MG) BY MOUTH DAILY 30 tablet 5   clotrimazole-betamethasone (LOTRISONE) cream Apply 1 application topically 2 (two) times daily. 45 g 1   ergocalciferol (VITAMIN D2) 1.25 MG (50000 UT) capsule Take 1 capsule (50,000 Units total) by mouth once a week. One capsule once weekly (Patient taking differently: Take 50,000 Units by mouth every Wednesday.) 12 capsule 2   lisinopril (ZESTRIL) 5 MG tablet TAKE 1 TABLET(5 MG) BY MOUTH DAILY 30 tablet 0   pantoprazole (PROTONIX) 20 MG tablet Take 1 tablet (20 mg total) by mouth daily. 90 tablet 1   phentermine (ADIPEX-P) 37.5 MG tablet Take half tablet by mouth every morning with breakfast 15 tablet 2   spironolactone (ALDACTONE) 50 MG tablet TAKE 1 TABLET(50 MG) BY MOUTH DAILY (Patient taking differently: Take 50 mg by mouth daily.) 30 tablet 4   No facility-administered medications prior to visit.    No Known Allergies  Review of Systems  Constitutional:  Negative for chills and fever.  Respiratory:  Negative for cough and shortness of breath.   Cardiovascular:  Positive for leg swelling. Negative for chest pain and palpitations.  Musculoskeletal:  Positive for arthralgias (B/l knee). Negative for neck pain and neck stiffness.  Skin:  Negative for rash.  Neurological:  Negative for dizziness and weakness.  Psychiatric/Behavioral:  Negative for agitation and behavioral problems.       Objective:    Physical Exam Vitals reviewed.  Constitutional:      General: She is not in acute distress.    Appearance: She is not diaphoretic.  HENT:     Head: Normocephalic and atraumatic.  Eyes:     General: No scleral icterus.    Extraocular Movements: Extraocular movements intact.  Cardiovascular:     Rate and Rhythm: Normal rate and regular rhythm.     Pulses: Normal pulses.     Heart sounds: Normal heart sounds. No murmur heard. Pulmonary:     Breath sounds: Normal breath sounds. No wheezing or rales.  Musculoskeletal:      Cervical back: Neck supple. No tenderness.     Right lower leg: No edema.     Left lower leg: Edema (Mild) present.  Skin:    General: Skin is warm.     Findings: No rash.  Neurological:     General: No focal deficit present.     Mental Status: She is alert and oriented to person, place, and time.  Psychiatric:        Mood and Affect: Mood normal.        Behavior: Behavior normal.  BP 128/78 (BP Location: Left Arm, Patient Position: Sitting, Cuff Size: Normal)   Pulse 78   Resp 18   Ht 5\' 5"  (1.651 m)   Wt 236 lb 0.6 oz (107.1 kg)   SpO2 100%   BMI 39.28 kg/m  Wt Readings from Last 3 Encounters:  10/31/21 236 lb 0.6 oz (107.1 kg)  10/04/21 233 lb 0.6 oz (105.7 kg)  06/27/21 210 lb (95.3 kg)        Assessment & Plan:   Left leg swelling Likely related to venous insufficiency, low risk for DVT Advised to avoid prolonged standing if possible Leg elevation and compression socks as tolerated US doppler LE and Vascular surgery follow up   No orders of the defined types were placed in this encounter.    Lindell Spar, MD

## 2021-10-31 NOTE — Patient Instructions (Signed)
Please follow up with Vascular surgery as scheduled for leg swelling.  Continue leg elevation and compression socks as tolerated.  Avoid prolonged standing.

## 2021-11-07 ENCOUNTER — Encounter: Payer: Self-pay | Admitting: Vascular Surgery

## 2021-11-07 ENCOUNTER — Other Ambulatory Visit: Payer: Self-pay

## 2021-11-07 ENCOUNTER — Ambulatory Visit: Payer: Commercial Managed Care - PPO | Admitting: Vascular Surgery

## 2021-11-07 ENCOUNTER — Ambulatory Visit (HOSPITAL_COMMUNITY)
Admission: RE | Admit: 2021-11-07 | Discharge: 2021-11-07 | Disposition: A | Discharge status: Home / Self Care | Payer: Commercial Managed Care - PPO | Source: Ambulatory Visit | Attending: Vascular Surgery | Admitting: Vascular Surgery

## 2021-11-07 VITALS — BP 110/74 | HR 89 | Temp 97.7°F | Resp 14 | Ht 65.0 in | Wt 234.0 lb

## 2021-11-07 DIAGNOSIS — M7989 Other specified soft tissue disorders: Secondary | ICD-10-CM | POA: Diagnosis not present

## 2021-11-07 DIAGNOSIS — I872 Venous insufficiency (chronic) (peripheral): Secondary | ICD-10-CM | POA: Diagnosis present

## 2021-11-07 NOTE — Progress Notes (Signed)
Patient name: Diamond Le MRN: [id number] DOB: Dec 21, 1965 Sex: female  REASON FOR CONSULT: Evaluate left leg swelling and possible venous insufficiency  HPI: Diamond Le is a 55 y.o. female, with history of hypertension that presents for evaluation of left leg swelling and possible underlying venous insufficiency.  Patient states she started noticing leg swelling on the left leg about 2 months ago.  She denies any history of DVT.  She denies any associated leg trauma.  She has been wearing compression stockings.  Today she feels both legs are about equal in size.  She is on her feet for long periods of time at work.  Past Medical History:  Diagnosis Date   Anemia    heavy flow   Folliculitis 05/16/3085   GERD (gastroesophageal reflux disease)    Hypertension    Macromastia    Obesity    Palpitation    Superficial fungus infection of skin 04/19/2015   Vitamin D deficiency     Past Surgical History:  Procedure Laterality Date   BREAST CYST EXCISION Right 12/14/2016   BREAST REDUCTION SURGERY Bilateral 02/21/2021   Procedure: MAMMARY REDUCTION  (BREAST);  Surgeon: [name], MD;  Location: [facility];  Service: Plastics;  Laterality: Bilateral;   COLONOSCOPY WITH PROPOFOL N/A 06/27/2021   Procedure: COLONOSCOPY WITH PROPOFOL;  Surgeon: Harvel Quale, MD;  [facility];  Service: Gastroenterology;  Laterality: N/A;  9:50   MASS EXCISION Right 12/14/2016   Procedure: EXCISION CYST RIGHT AXILLA;  Surgeon: Aviva Signs, MD;  Location: [facility];  Service: General;  Laterality: Right;   POLYPECTOMY  06/27/2021   Procedure: POLYPECTOMY;  Surgeon: Harvel Quale, MD;  [facility];  Service: Gastroenterology;;    Family History  Problem Relation Age of Onset   Asthma Mother        most of her life    Cancer Father        lung   Cancer Paternal Aunt        cervical    Hypertension Maternal Grandmother    Ovarian  cancer Other    Ovarian cancer Niece     SOCIAL HISTORY: Social History   Socioeconomic History   Marital status: Married    Spouse name: Not on file   Number of children: 2   Years of education: Not on file   Highest education level: Not on file  Occupational History   Occupation: nurse at [employer]   Tobacco Use   Smoking status: Never   Smokeless tobacco: Never  Vaping Use   Vaping Use: Never used  Substance and Sexual Activity   Alcohol use: No   Drug use: No   Sexual activity: Yes    Birth control/protection: Condom, None  Other Topics Concern   Not on file  Social History Narrative   Not on file   Social Determinants of Health   Financial Resource Strain: Low Risk    Difficulty of Paying Living Expenses: Not hard at all  Food Insecurity: No Food Insecurity   Worried About Charity fundraiser in the Last Year: Never true   [place] in the Last Year: Never true  Transportation Needs: No Transportation Needs   Lack of Transportation (Medical): No   Lack of Transportation (Non-Medical): No  Physical Activity: Insufficiently Active   Days of Exercise per Week: 2 days   Minutes of Exercise per Session: 30 min  Stress: No Stress  Concern Present   Feeling of Stress : Not at all  Social Connections: Socially Integrated   Frequency of Communication with Friends and Family: More than three times a week   Frequency of Social Gatherings with Friends and Family: Once a week   Attends Religious Services: 1 to 4 times per year   Active Member of [employer] or Organizations: No   Attends Music therapist: 1 to 4 times per year   Marital Status: Married  Human resources officer Violence: Not At Risk   Fear of Current or Ex-Partner: No   Emotionally Abused: No   Physically Abused: No   Sexually Abused: No    No Known Allergies  Current Outpatient Medications  Medication Sig Dispense Refill   amLODipine (NORVASC) 10 MG tablet TAKE 1 TABLET(10 MG) BY MOUTH DAILY  30 tablet 5   clotrimazole-betamethasone (LOTRISONE) cream Apply 1 application topically 2 (two) times daily. 45 g 1   ergocalciferol (VITAMIN D2) 1.25 MG (50000 UT) capsule Take 1 capsule (50,000 Units total) by mouth once a week. One capsule once weekly (Patient taking differently: Take 50,000 Units by mouth every Wednesday.) 12 capsule 2   lisinopril (ZESTRIL) 5 MG tablet TAKE 1 TABLET(5 MG) BY MOUTH DAILY 30 tablet 0   pantoprazole (PROTONIX) 20 MG tablet Take 1 tablet (20 mg total) by mouth daily. 90 tablet 1   phentermine (ADIPEX-P) 37.5 MG tablet Take half tablet by mouth every morning with breakfast 15 tablet 2   spironolactone (ALDACTONE) 50 MG tablet TAKE 1 TABLET(50 MG) BY MOUTH DAILY (Patient taking differently: Take 50 mg by mouth daily.) 30 tablet 4   No current facility-administered medications for this visit.    REVIEW OF SYSTEMS:  [X]  denotes positive finding, [ ]  denotes negative finding Cardiac  Comments:  Chest pain or chest pressure:    Shortness of breath upon exertion:    Short of breath when lying flat:    Irregular heart rhythm:        Vascular    Pain in calf, thigh, or hip brought on by ambulation:    Pain in feet at night that wakes you up from your sleep:     Blood clot in your veins:    Leg swelling:  x Left      Pulmonary    Oxygen at home:    Productive cough:     Wheezing:         Neurologic    Sudden weakness in arms or legs:     Sudden numbness in arms or legs:     Sudden onset of difficulty speaking or slurred speech:    Temporary loss of vision in one eye:     Problems with dizziness:         Gastrointestinal    Blood in stool:     Vomited blood:         Genitourinary    Burning when urinating:     Blood in urine:        Psychiatric    Major depression:         Hematologic    Bleeding problems:    Problems with blood clotting too easily:        Skin    Rashes or ulcers:        Constitutional    Fever or chills:       PHYSICAL EXAM: There were no vitals filed for this visit.  GENERAL: The patient is a well-nourished female,  in no acute distress. The vital signs are documented above. CARDIAC: There is a regular rate and rhythm.  VASCULAR:  Palpable femoral pulses bilaterally Palpable DP pulses bilaterally No notable lower extremity varicosities Both lower extremities are about equal in circumference PULMONARY: No respiratory distress ABDOMEN: Soft and non-tender.  MUSCULOSKELETAL: There are no major deformities or cyanosis. NEUROLOGIC: No focal weakness or paresthesias are detected. SKIN: There are no ulcers or rashes noted. PSYCHIATRIC: The patient has a normal affect.  DATA:   Indications: Swelling, and Pain.     Risk Factors: 6 months left leg edema.  Performing Technologist: Delorise Shiner RVT      Examination Guidelines: A complete evaluation includes B-mode imaging,  spectral  Doppler, color Doppler, and power Doppler as needed of all accessible  portions  of each vessel. Bilateral testing is considered an integral part of a  complete  examination. Limited examinations for reoccurring indications may be  performed  as noted. The reflux portion of the exam is performed with the patient in  reverse Trendelenburg.  Significant venous reflux is defined as >500 ms in the superficial venous  system, and >1 second in the deep venous system.      +--------------+---------+------+-----------+------------+--------+   LEFT           Reflux No Reflux Reflux Time Diameter cms Comments                              Yes                                       +--------------+---------+------+-----------+------------+--------+   CFV            no                                                   +--------------+---------+------+-----------+------------+--------+   FV prox        no                                                    +--------------+---------+------+-----------+------------+--------+   FV mid         no                                                   +--------------+---------+------+-----------+------------+--------+   FV dist        no                                                   +--------------+---------+------+-----------+------------+--------+   Popliteal      no                                                   +--------------+---------+------+-----------+------------+--------+  GSV at [facility]     no                              0.481                +--------------+---------+------+-----------+------------+--------+   GSV prox thigh no                              0.341                +--------------+---------+------+-----------+------------+--------+   GSV mid thigh  no                              0.250                +--------------+---------+------+-----------+------------+--------+   GSV dist thigh no                              0.341                +--------------+---------+------+-----------+------------+--------+   GSV at knee    no                              0.402                +--------------+---------+------+-----------+------------+--------+   GSV prox calf                                  0.391                +--------------+---------+------+-----------+------------+--------+   GSV mid calf                                   0.384                +--------------+---------+------+-----------+------------+--------+   SSV Pop Fossa  no                              0.158                +--------------+---------+------+-----------+------------+--------+   SSV prox calf  no                              0.154                +--------------+---------+------+-----------+------------+--------+   SSV mid calf                                   0.147                +--------------+---------+------+-----------+------------+--------+         Summary:  Left:  - No evidence of deep vein  thrombosis seen in the left lower extremity,  from the common femoral through the popliteal veins.  - No evidence of superficial venous thrombosis in the left lower  extremity.  - There is no evidence of venous reflux seen in the  left lower extremity.  - No evidence of superficial venous reflux seen in the left greater  saphenous vein.  - No evidence of superficial venous reflux seen in the left short  saphenous vein.   Assessment/Plan:  55 year old female presents for evaluation of left leg swelling for about 2 months and possible venous insufficiency.  I discussed with her today that her venous reflux study is normal and shows no evidence of underlying venous insufficiency and no DVT.  Discussed I do not think venous insufficiency explains her leg swelling given the above findings.  Happy to see her in the future if things change.   Marty Heck, MD [facility] Office: [phone]

## 2021-12-14 ENCOUNTER — Ambulatory Visit: Payer: Commercial Managed Care - PPO | Admitting: Family Medicine

## 2021-12-17 ENCOUNTER — Other Ambulatory Visit: Payer: Self-pay | Admitting: Family Medicine

## 2021-12-22 ENCOUNTER — Encounter: Payer: Self-pay | Admitting: Family Medicine

## 2021-12-22 ENCOUNTER — Ambulatory Visit: Payer: Commercial Managed Care - PPO | Admitting: Family Medicine

## 2021-12-22 ENCOUNTER — Other Ambulatory Visit: Payer: Self-pay

## 2021-12-22 VITALS — BP 142/84 | HR 76 | Resp 16 | Ht 65.0 in | Wt 243.0 lb

## 2021-12-22 DIAGNOSIS — Z6841 Body Mass Index (BMI) 40.0 and over, adult: Secondary | ICD-10-CM

## 2021-12-22 DIAGNOSIS — K219 Gastro-esophageal reflux disease without esophagitis: Secondary | ICD-10-CM

## 2021-12-22 DIAGNOSIS — M79605 Pain in left leg: Secondary | ICD-10-CM | POA: Diagnosis not present

## 2021-12-22 DIAGNOSIS — I10 Essential (primary) hypertension: Secondary | ICD-10-CM

## 2021-12-22 MED ORDER — LISINOPRIL 5 MG PO TABS: 5.0000 mg | ORAL_TABLET | Freq: Every day | ORAL | 3 refills | Status: DC

## 2021-12-22 NOTE — Patient Instructions (Signed)
F/U in 4 months, call if you need me before  Return for shingrix #2 in next 1 to 4 weeks  Left leg pain is from arthritis in your back, work on weight loss and good posture  Medications as discussed and listed

## 2021-12-24 ENCOUNTER — Encounter: Payer: Self-pay | Admitting: Family Medicine

## 2021-12-24 DIAGNOSIS — M79605 Pain in left leg: Secondary | ICD-10-CM | POA: Insufficient documentation

## 2021-12-24 MED ORDER — FLUCONAZOLE 150 MG PO TABS: 150.0000 mg | ORAL_TABLET | Freq: Once | ORAL | 2 refills | Status: AC

## 2021-12-24 NOTE — Assessment & Plan Note (Signed)
Controlled, no change in medication  

## 2021-12-24 NOTE — Progress Notes (Signed)
° °  Diamond Le     MRN: [id number]      DOB: Jul 01, 1966   HPI Diamond Le is here for follow up and re-evaluation of chronic medical conditions, medication management and review of any available recent lab and radiology data.  Preventive health is updated, specifically  Cancer screening and Immunization.   Still c/o left leg pain, had negative [place] of left leg, wonders about leg mass, I advise her that if this was present [place] would have reported, Pian is from low back and I recommed imaging study  Ongoing weight gain, plans to work on weight loss , finding it challenging ROS Denies recent fever or chills. Denies sinus pressure, nasal congestion, ear pain or sore throat. Denies chest congestion, productive cough or wheezing. Denies chest pains, palpitations and leg swelling Denies abdominal pain, nausea, vomiting,diarrhea or constipation.   Denies dysuria, frequency, hesitancy or incontinence.  Denies headaches, seizures, numbness, or tingling. Denies depression, anxiety or insomnia. Denies skin break down or rash.   PE  BP (!) 142/84    Pulse 76    Resp 16    Ht 5\' 5"  (1.651 m)    Wt 243 lb (110.2 kg)    SpO2 97%    BMI 40.44 kg/m   Patient alert and oriented and in no cardiopulmonary distress.  HEENT: No facial asymmetry, EOMI,     Neck supple .  Chest: Clear to auscultation bilaterally.  CVS: S1, S2 no murmurs, no S3.Regular rate.  ABD: Soft non tender.   Ext: No edema  MS: Adequate ROM spine, shoulders, hips and knees.  Skin: Intact, no ulcerations or rash noted.  Psych: Good eye contact, normal affect. Memory intact not anxious or depressed appearing.  CNS: CN 2-12 intact, power,  normal throughout.no focal deficits noted.   Assessment & Plan  Essential hypertension Uncontrolled , med adjustment made DASH diet and commitment to daily physical activity for a minimum of 30 minutes discussed and encouraged, as a part of hypertension management. The importance of  attaining a healthy weight is also discussed.  BP/Weight 12/22/2021 11/07/2021 10/31/2021 10/04/2021 06/27/2021 06/08/2021 07/30/91  Systolic BP 330 076 226 333 545 625 638  Diastolic BP 84 74 78 76 58 84 73  Wt. (Lbs) 243 234 236.04 233.04 210 231.12 -  BMI 40.44 38.94 39.28 38.78 34.95 38.46 -       Obesity (BMI 30-39.9)  Patient re-educated about  the importance of commitment to a  minimum of 150 minutes of exercise per week as able.  The importance of healthy food choices with portion control discussed, as well as eating regularly and within a 12 hour window most days. The need to choose "clean , green" food 50 to 75% of the time is discussed, as well as to make water the primary drink and set a goal of 64 ounces water daily.    Weight /BMI 12/22/2021 11/07/2021 10/31/2021  WEIGHT 243 lb 234 lb 236 lb 0.6 oz  HEIGHT 5\' 5"  5\' 5"  5\' 5"   BMI 40.44 kg/m2 38.94 kg/m2 39.28 kg/m2    Deteriorated   Left leg pain radiaiting from lower back, encouraged weight loss and back exercises and good posture  GERD (gastroesophageal reflux disease) Controlled, no change in medication

## 2021-12-24 NOTE — Assessment & Plan Note (Signed)
radiaiting from lower back, encouraged weight loss and back exercises and good posture

## 2021-12-24 NOTE — Assessment & Plan Note (Signed)
Uncontrolled , med adjustment made DASH diet and commitment to daily physical activity for a minimum of 30 minutes discussed and encouraged, as a part of hypertension management. The importance of attaining a healthy weight is also discussed.  BP/Weight 12/22/2021 11/07/2021 10/31/2021 10/04/2021 06/27/2021 06/08/2021 0/06/6760  Systolic BP 950 932 671 245 809 983 382  Diastolic BP 84 74 78 76 58 84 73  Wt. (Lbs) 243 234 236.04 233.04 210 231.12 -  BMI 40.44 38.94 39.28 38.78 34.95 38.46 -

## 2021-12-24 NOTE — Assessment & Plan Note (Signed)
°  Patient re-educated about  the importance of commitment to a  minimum of 150 minutes of exercise per week as able.  The importance of healthy food choices with portion control discussed, as well as eating regularly and within a 12 hour window most days. The need to choose "clean , green" food 50 to 75% of the time is discussed, as well as to make water the primary drink and set a goal of 64 ounces water daily.    Weight /BMI 12/22/2021 11/07/2021 10/31/2021  WEIGHT 243 lb 234 lb 236 lb 0.6 oz  HEIGHT 5\' 5"  5\' 5"  5\' 5"   BMI 40.44 kg/m2 38.94 kg/m2 39.28 kg/m2    Deteriorated

## 2022-02-11 ENCOUNTER — Ambulatory Visit
Admission: EM | Admit: 2022-02-11 | Discharge: 2022-02-11 | Disposition: A | Discharge status: Home / Self Care | Payer: Commercial Managed Care - PPO | Attending: Urgent Care | Admitting: Urgent Care

## 2022-02-11 ENCOUNTER — Other Ambulatory Visit: Payer: Self-pay

## 2022-02-11 DIAGNOSIS — M7989 Other specified soft tissue disorders: Secondary | ICD-10-CM

## 2022-02-11 DIAGNOSIS — M79662 Pain in left lower leg: Secondary | ICD-10-CM

## 2022-02-11 MED ORDER — DICLOFENAC SODIUM 3 % EX GEL: 1.0000 "application " | Freq: Two times a day (BID) | CUTANEOUS | 0 refills | Status: DC | PRN

## 2022-02-11 NOTE — ED Triage Notes (Signed)
Pt reports left leg pain since July, 2022. Pt reports when she lay down over the left side of the body she has shooting pain in the left leg.  ?

## 2022-02-11 NOTE — ED Provider Notes (Signed)
?[place] ? ? ?MRN: [id number] DOB: [birth year] ? ?Subjective:  ? ?Diamond Le is a 56 y.o. female presenting for 105-monthhistory of persistent intermittent left lower leg pain with swelling.  The pain is mostly anterior lateral and can radiate up and down her leg.  Patient has had 2 ultrasounds and has not had a DVT.  She has been working with her PCP but is requesting an MRI today.  Has not used any medications for this particular pain that has flared up in the past few days.  Feels superficial tenderness when she lays on the area.  She is not a smoker.  No history of peripheral vascular disease.  She does not have an orthopedist. ? ?No current facility-administered medications for this encounter. ? ?Current Outpatient Medications:  ?  amLODipine (NORVASC) 10 MG tablet, TAKE 1 TABLET(10 MG) BY MOUTH DAILY, Disp: 30 tablet, Rfl: 5 ?  clotrimazole-betamethasone (LOTRISONE) cream, Apply 1 application topically 2 (two) times daily., Disp: 45 g, Rfl: 1 ?  ergocalciferol (VITAMIN D2) 1.25 MG (50000 UT) capsule, Take 1 capsule (50,000 Units total) by mouth once a week. One capsule once weekly (Patient taking differently: Take 50,000 Units by mouth every Wednesday.), Disp: 12 capsule, Rfl: 2 ?  lisinopril (ZESTRIL) 5 MG tablet, Take 1 tablet (5 mg total) by mouth daily., Disp: 90 tablet, Rfl: 3 ?  pantoprazole (PROTONIX) 20 MG tablet, Take 1 tablet (20 mg total) by mouth daily., Disp: 90 tablet, Rfl: 1 ?  spironolactone (ALDACTONE) 50 MG tablet, TAKE 1 TABLET(50 MG) BY MOUTH DAILY, Disp: 30 tablet, Rfl: 4  ? ?No Known Allergies ? ?Past Medical History:  ?Diagnosis Date  ? Anemia   ? heavy flow  ? Folliculitis 56/59/9357 ? GERD (gastroesophageal reflux disease)   ? Hypertension   ? Macromastia   ? Obesity   ? Palpitation   ? Superficial fungus infection of skin 04/19/2015  ? Vitamin D deficiency   ?  ? ?Past Surgical History:  ?Procedure Laterality Date  ? BREAST CYST EXCISION Right 12/14/2016  ?  BREAST REDUCTION SURGERY Bilateral 02/21/2021  ? Procedure: MAMMARY REDUCTION  (BREAST);  Surgeon: PCindra Presume MD;  Location: [facility]  Service: Plastics;  Laterality: Bilateral;  ? COLONOSCOPY WITH PROPOFOL N/A 06/27/2021  ? Procedure: COLONOSCOPY WITH PROPOFOL;  Surgeon: CHarvel Quale MD;  [facility];  Service: Gastroenterology;  Laterality: N/A;  9:50  ? MASS EXCISION Right 12/14/2016  ? Procedure: EXCISION CYST RIGHT AXILLA;  Surgeon: MAviva Signs MD;  [facility]: [facility];  Service: General;  Laterality: Right;  ? POLYPECTOMY  06/27/2021  ? Procedure: POLYPECTOMY;  Surgeon: CHarvel Quale MD;  [facility];  Service: Gastroenterology;;  ? ? ?Family History  ?Problem Relation Age of Onset  ? Asthma Mother   ?     most of her life   ? Cancer Father   ?     lung  ? Cancer Paternal Aunt   ?     cervical   ? Hypertension Maternal Grandmother   ? Ovarian cancer Other   ? Ovarian cancer Niece   ? ? ?Social History  ? ?Tobacco Use  ? Smoking status: Never  ? Smokeless tobacco: Never  ?Vaping Use  ? Vaping Use: Never used  ?Substance Use Topics  ? Alcohol use: No  ? Drug use: Never  ? ? ?ROS ? ? ?Objective:  ? ?Vitals: ?BP 135/84 (BP Location: Right Arm)  Pulse 88   Temp 98 ?F (36.7 ?C) (Oral)   Resp 18   LMP  (Within Weeks) Comment: 1 week  SpO2 98%  ? ?Physical Exam ?Constitutional:   ?   General: She is not in acute distress. ?   Appearance: Normal appearance. She is well-developed. She is not ill-appearing, toxic-appearing or diaphoretic.  ?HENT:  ?   Head: Normocephalic and atraumatic.  ?   Nose: Nose normal.  ?   Mouth/Throat:  ?   Mouth: Mucous membranes are moist.  ?Eyes:  ?   General: No scleral icterus.    ?   Right eye: No discharge.     ?   Left eye: No discharge.  ?   Extraocular Movements: Extraocular movements intact.  ?Cardiovascular:  ?   Rate and Rhythm: Normal rate.  ?Pulmonary:  ?   Effort: Pulmonary effort is normal.   ?Musculoskeletal:  ?     Legs: ? ?   Comments: No calf tenderness.  Patient ambulates without assistance at an expected pace.  ?Skin: ?   General: Skin is warm and dry.  ?Neurological:  ?   General: No focal deficit present.  ?   Mental Status: She is alert and oriented to person, place, and time.  ?Psychiatric:     ?   Mood and Affect: Mood normal.     ?   Behavior: Behavior normal.  ? ? ?Assessment and Plan :  ? ?PDMP not reviewed this encounter. ? ?1. Pain and swelling of left lower leg   ? ?Unfortunately I am not able to obtain MRIs.  I discussed this with patient and recommended follow-up with her PCP or Ortho care with Dr. Aline Brochure.  In the meantime, patient can try diclofenac gel for local pain relief. Deferred imaging given lack of tenderness elicited on exam and superficial nature of her pain, no traumas.  Counseled patient on potential for adverse effects with medications prescribed/recommended today, ER and return-to-clinic precautions discussed, patient verbalized understanding. ? ?  ?Jaynee Eagles, PA-C ?02/11/22 6812 ? ?

## 2022-03-15 ENCOUNTER — Other Ambulatory Visit: Payer: Self-pay | Admitting: Family Medicine

## 2022-03-15 ENCOUNTER — Telehealth: Payer: Self-pay

## 2022-03-15 DIAGNOSIS — M79605 Pain in left leg: Secondary | ICD-10-CM

## 2022-03-15 NOTE — Telephone Encounter (Signed)
Patient called asked if Dr Moshe Cipro would go ahead and send a referral to Dr Arther Abbott or MRI for left knee as discuss in last visit.   ?

## 2022-03-15 NOTE — Telephone Encounter (Signed)
Referral sent to ortho

## 2022-03-16 ENCOUNTER — Encounter: Payer: Self-pay | Admitting: Orthopedic Surgery

## 2022-03-23 ENCOUNTER — Ambulatory Visit: Payer: Commercial Managed Care - PPO | Admitting: Orthopedic Surgery

## 2022-03-23 ENCOUNTER — Encounter: Payer: Self-pay | Admitting: Orthopedic Surgery

## 2022-03-23 ENCOUNTER — Ambulatory Visit (INDEPENDENT_AMBULATORY_CARE_PROVIDER_SITE_OTHER): Payer: Commercial Managed Care - PPO

## 2022-03-23 DIAGNOSIS — M1712 Unilateral primary osteoarthritis, left knee: Secondary | ICD-10-CM

## 2022-03-23 DIAGNOSIS — M25562 Pain in left knee: Secondary | ICD-10-CM | POA: Diagnosis not present

## 2022-03-23 DIAGNOSIS — G8929 Other chronic pain: Secondary | ICD-10-CM | POA: Diagnosis not present

## 2022-03-23 NOTE — Progress Notes (Signed)
New Patient Visit ? ?Assessment: ?Diamond Le is a 56 y.o. female with the following: ?1. Chronic pain of left knee ? ?Plan: ?Diamond Le has pain in the left leg.  Radiographs demonstrates minimal degenerative changes of the left knee.  Pain is primarily over the lateral leg, and radiates from the hip area, distally to her foot.  She does have CT scan demonstrating anterolisthesis of L5-S1, which could contribute to the radiating pains.  However, I am unsure what is causing the swelling within the anterior compartment of the lower leg.  Within the anterior compartment musculature, there are no nodules.  No evidence or concern for a mass.  She has some crepitus in the left knee, but this does not bother her.  It is unclear what is causing all of her symptoms at this time.  I have advised her to continue to monitor the symptoms closely.  If she notes worsening pain, swelling or evidence of a mass, I have asked her to contact clinic or follow-up as needed. ? ?Follow-up: ?Return if symptoms worsen or fail to improve. ? ?Subjective: ? ?Chief Complaint  ?Patient presents with  ? Knee Pain  ?  Left knee/ant/lat lower leg pain, swelling at ant/lat of lower leg.  NKI onset about a year ago, knee creaks some, o/w no mechanical symptoms.   ? ? ?History of Present Illness: ?Diamond Le is a 56 y.o. female who has been referred by   Tula Nakayama, MD for evaluation of left lower leg pain and swelling.  She states over the past year, she has noticed some pain in the anterior and lateral aspect of the lower leg.  She has radiating pains on the lateral aspect of the left hip, distally to the lateral aspect of her left ankle.  No specific injury.  She also feels as though her lower leg is swollen on the left.  She occasionally takes medications for the pain.  She notices some grinding in the left knee, but this is not painful.  She has not worked with physical therapy.  She recently had an ultrasound, which was  negative for mass or DVT. ? ? ?Review of Systems: ?No fevers or chills ?No numbness or tingling ?No chest pain ?No shortness of breath ?No bowel or bladder dysfunction ?No GI distress ?No headaches ? ? ?Medical History: ? ?Past Medical History:  ?Diagnosis Date  ? Anemia   ? heavy flow  ? Folliculitis 5/63/8756  ? GERD (gastroesophageal reflux disease)   ? Hypertension   ? Macromastia   ? Obesity   ? Palpitation   ? Superficial fungus infection of skin 04/19/2015  ? Vitamin D deficiency   ? ? ?Past Surgical History:  ?Procedure Laterality Date  ? BREAST CYST EXCISION Right 12/14/2016  ? BREAST REDUCTION SURGERY Bilateral 02/21/2021  ? Procedure: MAMMARY REDUCTION  (BREAST);  Surgeon: [name], MD;  Location: [facility];  Service: Plastics;  Laterality: Bilateral;  ? COLONOSCOPY WITH PROPOFOL N/A 06/27/2021  ? Procedure: COLONOSCOPY WITH PROPOFOL;  Surgeon: Harvel Quale, MD;  [facility];  Service: Gastroenterology;  Laterality: N/A;  9:50  ? MASS EXCISION Right 12/14/2016  ? Procedure: EXCISION CYST RIGHT AXILLA;  Surgeon: Aviva Signs, MD;  [facility];  Service: General;  Laterality: Right;  ? POLYPECTOMY  06/27/2021  ? Procedure: POLYPECTOMY;  Surgeon: Harvel Quale, MD;  [facility];  Service: Gastroenterology;;  ? ? ?Family History  ?Problem Relation  Age of Onset  ? Asthma Mother   ?     most of her life   ? Cancer Father   ?     lung  ? Cancer Paternal Aunt   ?     cervical   ? Hypertension Maternal Grandmother   ? Ovarian cancer Other   ? Ovarian cancer Niece   ? ?Social History  ? ?Tobacco Use  ? Smoking status: Never  ? Smokeless tobacco: Never  ?Vaping Use  ? Vaping Use: Never used  ?Substance Use Topics  ? Alcohol use: No  ? Drug use: Never  ? ? ?No Known Allergies ? ?Current Meds  ?Medication Sig  ? amLODipine (NORVASC) 10 MG tablet TAKE 1 TABLET(10 MG) BY MOUTH DAILY  ? clotrimazole-betamethasone (LOTRISONE) cream Apply 1 application  topically 2 (two) times daily.  ? Diclofenac Sodium 3 % GEL Apply 1 application. topically 2 (two) times daily as needed.  ? ergocalciferol (VITAMIN D2) 1.25 MG (50000 UT) capsule Take 1 capsule (50,000 Units total) by mouth once a week. One capsule once weekly (Patient taking differently: Take 50,000 Units by mouth every Wednesday.)  ? lisinopril (ZESTRIL) 5 MG tablet Take 1 tablet (5 mg total) by mouth daily.  ? pantoprazole (PROTONIX) 20 MG tablet Take 1 tablet (20 mg total) by mouth daily.  ? spironolactone (ALDACTONE) 50 MG tablet TAKE 1 TABLET(50 MG) BY MOUTH DAILY  ? ? ?Objective: ?There were no vitals taken for this visit. ? ?Physical Exam: ? ?General: Alert and oriented. and No acute distress. ?Gait: Normal gait. ? ?Evaluation left knee demonstrates mild effusion.  Mild swelling is appreciated in the anterior lateral aspect of the lower leg.  Sensations intact over the dorsum of her foot.  Negative Tinel's in the proximal fibula.  No tenderness palpation over the lateral hip.  Negative straight leg raise.  Strength in bilateral lower extremities is 5/5. ? ?IMAGING: ?I personally ordered and reviewed the following images ? ?X-rays of the left knee were obtained in clinic today.  Mild degenerative changes, including loss of joint space within the medial compartment, as well as the patellofemoral compartment.  Minimal osteophytes are noted.  No acute injuries. ? ?Impression: Mild left knee arthritis ? ?New Medications:  ?No orders of the defined types were placed in this encounter. ? ? ? ? ?Mordecai Rasmussen, MD ? ?03/23/2022 ?2:13 PM ? ? ?

## 2022-04-07 IMAGING — MG MM DIGITAL SCREENING BILAT W/ TOMO AND CAD
6 of 10 series · 6 of 30 positions shown · non-contrast
Comparison: Previous exam(s).

CLINICAL DATA: Screening.

EXAM:
DIGITAL SCREENING BILATERAL MAMMOGRAM WITH TOMOSYNTHESIS AND CAD

[L MLO synth-2D (1 of 2)]
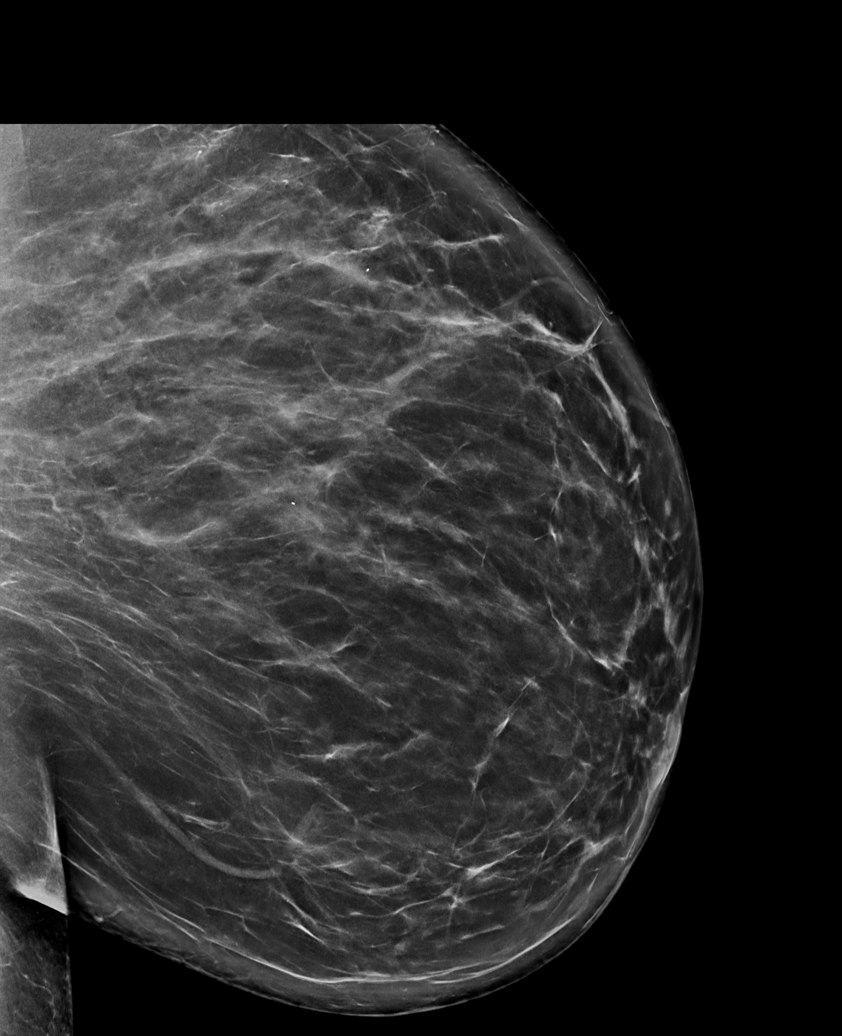

[R MLO synth-2D]
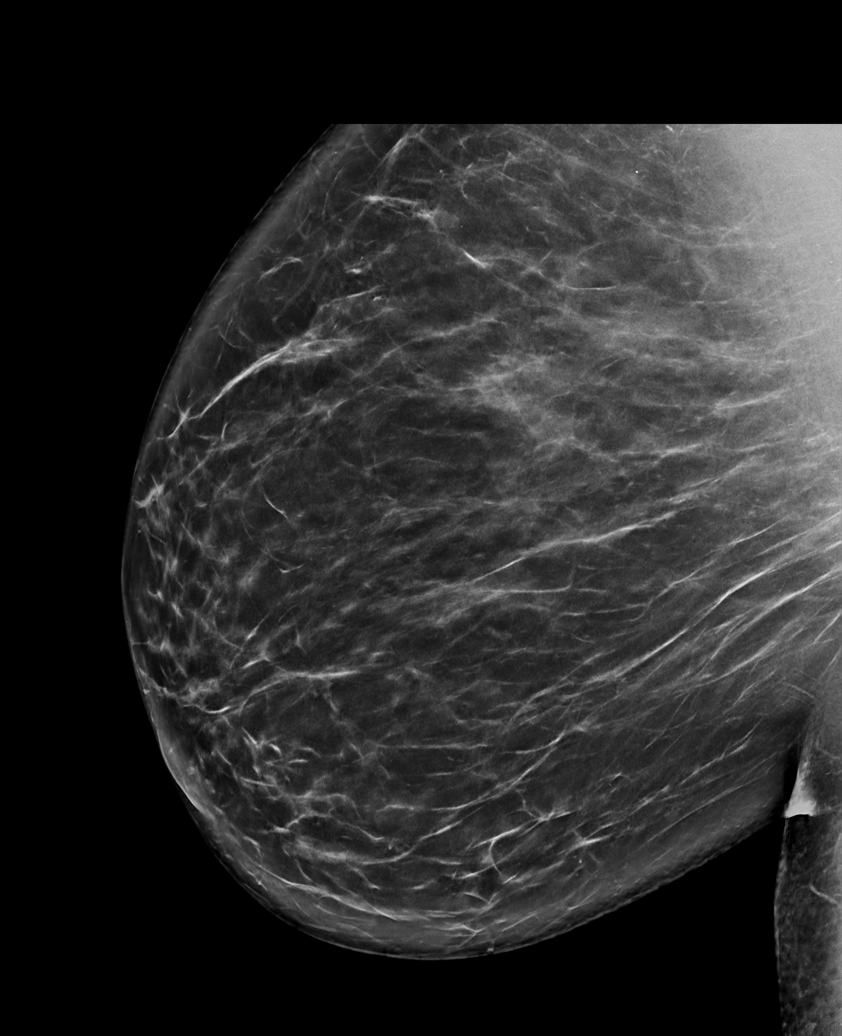

[L MLO synth-2D (2 of 2)]
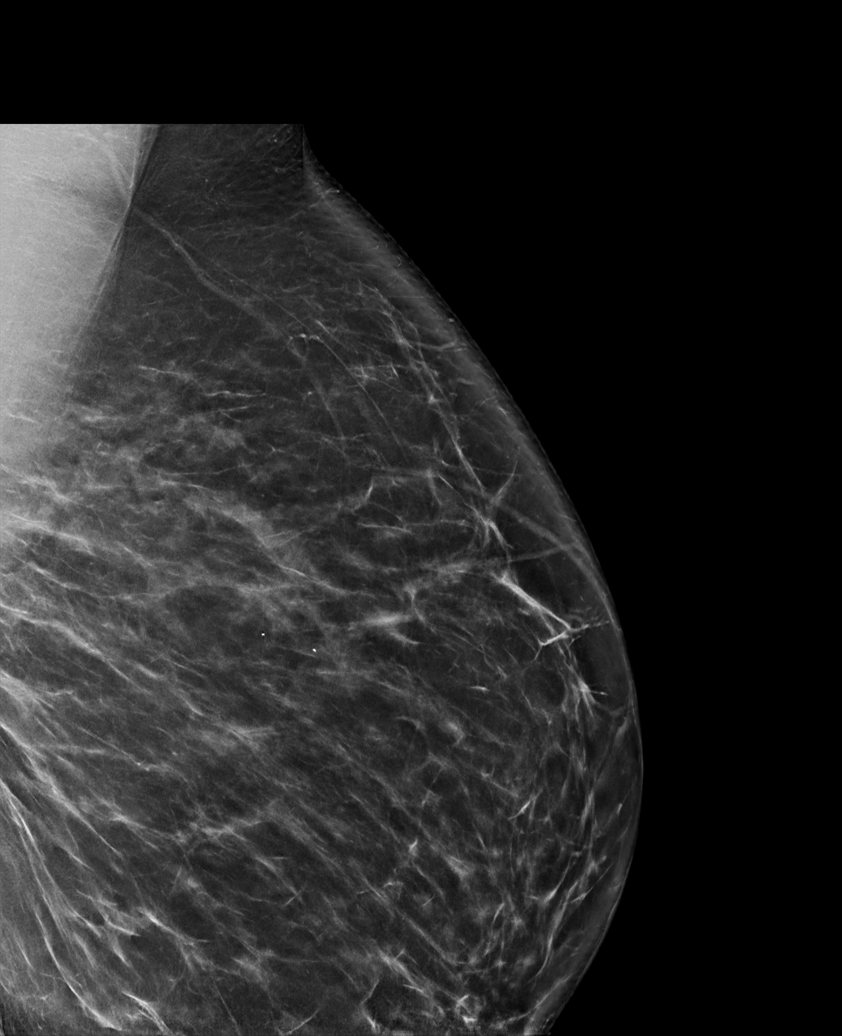

[L CC synth-2D]
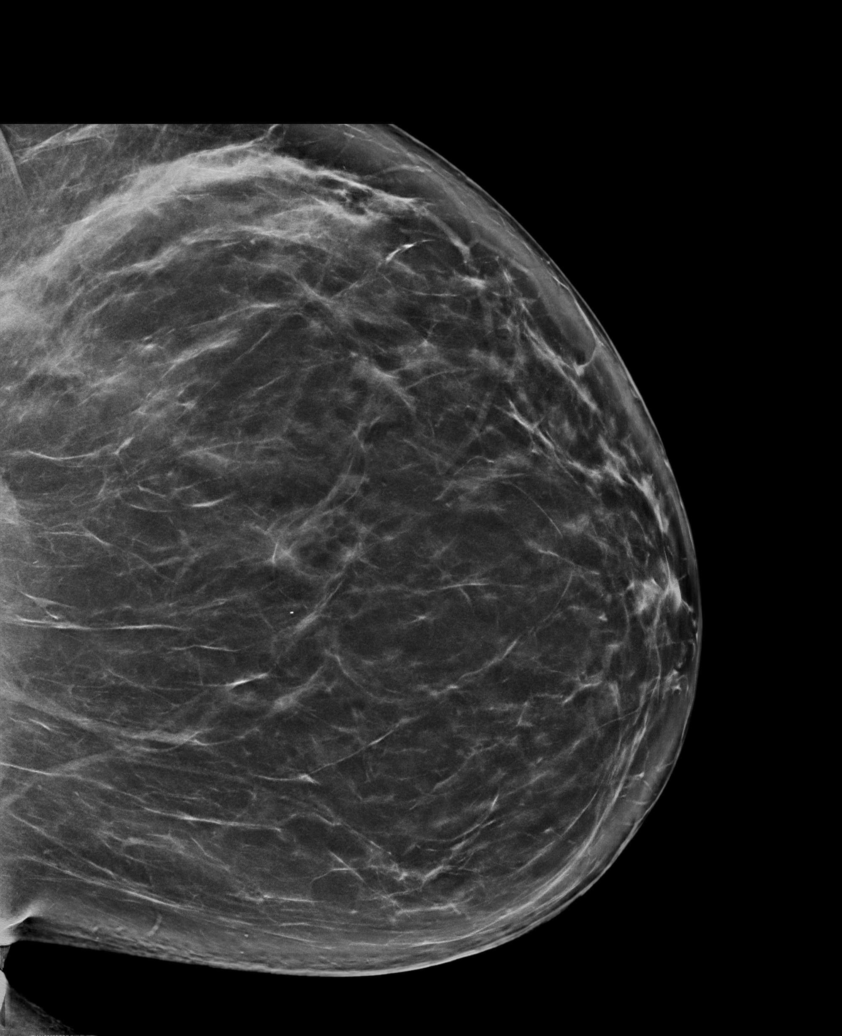

[R CC synth-2D]
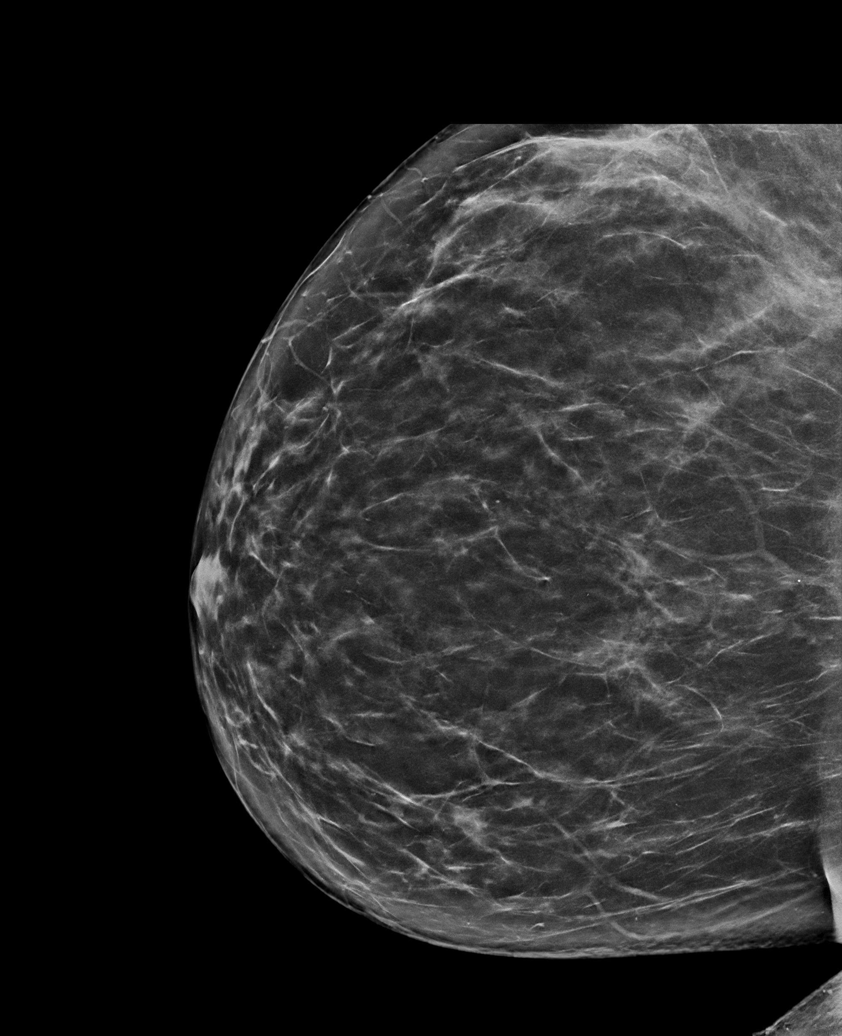

[R MLO tomo · tomo slice 49/98.0]
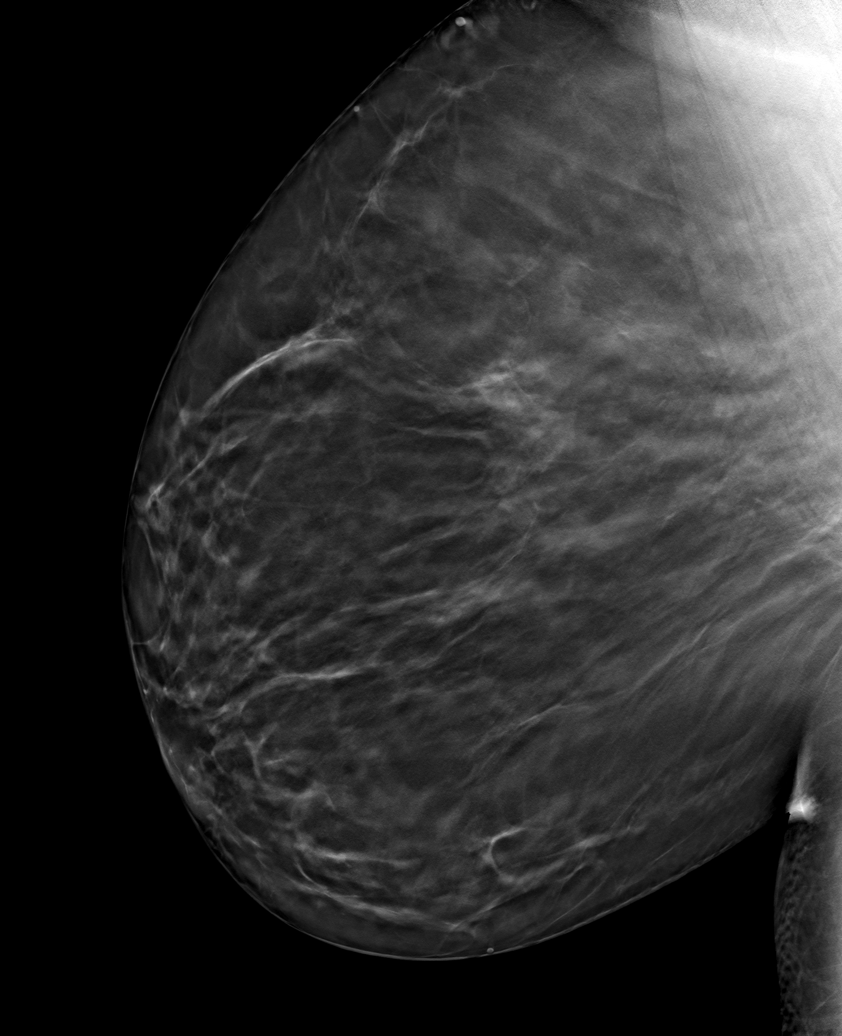

[6 of 30 positions shown; findings below may reference images not displayed]

ACR Breast Density Category b: There are scattered areas of
fibroglandular density.
FINDINGS: There are no findings suspicious for malignancy. The images were
evaluated with computer-aided detection.
IMPRESSION: No mammographic evidence of malignancy. A result letter of this
screening mammogram will be mailed directly to the patient.

RECOMMENDATION:
Screening mammogram in one year. (Code:[id number]-[id number])

BI-RADS CATEGORY  1: Negative.

## 2022-04-20 ENCOUNTER — Ambulatory Visit: Payer: Commercial Managed Care - PPO | Admitting: Family Medicine

## 2022-04-27 ENCOUNTER — Ambulatory Visit: Payer: Commercial Managed Care - PPO | Admitting: Family Medicine

## 2022-05-01 ENCOUNTER — Encounter: Payer: Self-pay | Admitting: Family Medicine

## 2022-05-01 ENCOUNTER — Ambulatory Visit: Payer: Commercial Managed Care - PPO | Admitting: Family Medicine

## 2022-05-01 VITALS — BP 131/79 | HR 71 | Ht 66.0 in | Wt 232.0 lb

## 2022-05-01 DIAGNOSIS — Z13220 Encounter for screening for lipoid disorders: Secondary | ICD-10-CM

## 2022-05-01 DIAGNOSIS — I10 Essential (primary) hypertension: Secondary | ICD-10-CM | POA: Diagnosis not present

## 2022-05-01 DIAGNOSIS — H60501 Unspecified acute noninfective otitis externa, right ear: Secondary | ICD-10-CM | POA: Diagnosis not present

## 2022-05-01 DIAGNOSIS — H6091 Unspecified otitis externa, right ear: Secondary | ICD-10-CM | POA: Insufficient documentation

## 2022-05-01 DIAGNOSIS — E559 Vitamin D deficiency, unspecified: Secondary | ICD-10-CM | POA: Diagnosis not present

## 2022-05-01 DIAGNOSIS — E669 Obesity, unspecified: Secondary | ICD-10-CM

## 2022-05-01 DIAGNOSIS — M79605 Pain in left leg: Secondary | ICD-10-CM

## 2022-05-01 DIAGNOSIS — H9201 Otalgia, right ear: Secondary | ICD-10-CM

## 2022-05-01 MED ORDER — CIPROFLOXACIN-DEXAMETHASONE 0.3-0.1 % OT SUSP: OTIC | 0 refills | Status: DC

## 2022-05-01 MED ORDER — CIPROFLOXACIN-DEXAMETHASONE 0.3-0.1 % OT SUSP: 4.0000 [drp] | Freq: Two times a day (BID) | OTIC | 0 refills | Status: DC

## 2022-05-01 NOTE — Progress Notes (Signed)
Diamond Le     MRN: [id number]      DOB: 1966-05-16   HPI Diamond Le is here for follow up and re-evaluation of chronic medical conditions, medication management and review of any available recent lab and radiology data.  Preventive health is updated, specifically  Cancer screening and Immunization.   Questions or concerns regarding consultations or procedures which the PT has had in the interim are  addressed. The PT denies any adverse reactions to current medications since the last visit.  C/o persistent left leg pain and swelling, disturbs sleep C/o right ear pain, had been attempting clean over past 5 days ROS Denies recent fever or chills. Denies sinus pressure, nasal congestion,  or sore throat. Denies chest congestion, productive cough or wheezing. Denies chest pains, palpitations and leg swelling Denies abdominal pain, nausea, vomiting,diarrhea or constipation.   Denies dysuria, frequency, hesitancy or incontinence. Denies joint pain,  and limitation in mobility. Denies headaches, seizures, numbness, or tingling. Denies depression, anxiety or insomnia. Denies skin break down or rash.   PE  BP 131/79   Pulse 71   Ht '5\' 6"'$  (1.676 m)   Wt 232 lb (105.2 kg)   SpO2 96%   BMI 37.45 kg/m   Patient alert and oriented and in no cardiopulmonary distress.  HEENT: No facial asymmetry, EOMI,     Neck supple .TM clear bilaterallly with good loight reflex, right outer area slightly inflamed  Chest: Clear to auscultation bilaterally.  CVS: S1, S2 no murmurs, no S3.Regular rate.  ABD: Soft non tender.   Ext: No edema  MS: Adequate ROM spine, shoulders, hips and knees.left calf larger than riht , possible lipoma on upper ;left leg , area tender  Skin: Intact, no ulcerations or rash noted.  Psych: Good eye contact, normal affect. Memory intact not anxious or depressed appearing.  CNS: CN 2-12 intact, power,  normal throughout.no focal deficits noted.   Assessment &  Plan  Essential hypertension Controlled, no change in medication DASH diet and commitment to daily physical activity for a minimum of 30 minutes discussed and encouraged, as a part of hypertension management. The importance of attaining a healthy weight is also discussed.     05/01/2022    1:52 PM 02/11/2022    9:04 AM 12/22/2021    2:34 PM 11/07/2021   11:06 AM 10/31/2021    1:18 PM 10/04/2021    3:10 PM 06/27/2021   11:04 AM  BP/Weight  Systolic BP 458 099 833 825 053 976 734  Diastolic BP 79 84 84 74 78 76 58  Wt. (Lbs) 232  243 234 236.04 233.04   BMI 37.45 kg/m2  40.44 kg/m2 38.94 kg/m2 39.28 kg/m2 38.78 kg/m2        Left leg pain Localized pain and swelling left upper leg possible lipoma, need [place], chronic complaint for over 6 months  Right otitis externa ciprodex  prescribed fo short term use  Obesity (BMI 30-39.9) Improved, congratulated on this and is encouraged to keep it up  Patient re-educated about  the importance of commitment to a  minimum of 150 minutes of exercise per week as able.  The importance of healthy food choices with portion control discussed, as well as eating regularly and within a 12 hour window most days. The need to choose "clean , green" food 50 to 75% of the time is discussed, as well as to make water the primary drink and set a goal of 64 ounces water daily.  05/01/2022    1:52 PM 12/22/2021    2:34 PM 11/07/2021   11:06 AM  Weight /BMI  Weight 232 lb 243 lb 234 lb  Height '5\' 6"'$  (1.676 m) '5\' 5"'$  (1.651 m) '5\' 5"'$  (1.651 m)  BMI 37.45 kg/m2 40.44 kg/m2 38.94 kg/m2

## 2022-05-01 NOTE — Assessment & Plan Note (Signed)
Controlled, no change in medication DASH diet and commitment to daily physical activity for a minimum of 30 minutes discussed and encouraged, as a part of hypertension management. The importance of attaining a healthy weight is also discussed.     05/01/2022    1:52 PM 02/11/2022    9:04 AM 12/22/2021    2:34 PM 11/07/2021   11:06 AM 10/31/2021    1:18 PM 10/04/2021    3:10 PM 06/27/2021   11:04 AM  BP/Weight  Systolic BP 445 848 350 757 322 567 209  Diastolic BP 79 84 84 74 78 76 58  Wt. (Lbs) 232  243 234 236.04 233.04   BMI 37.45 kg/m2  40.44 kg/m2 38.94 kg/m2 39.28 kg/m2 38.78 kg/m2

## 2022-05-01 NOTE — Assessment & Plan Note (Signed)
Localized pain and swelling left upper leg possible lipoma, need [place], chronic complaint for over 6 months

## 2022-05-01 NOTE — Assessment & Plan Note (Addendum)
ciprodex  prescribed fo short term use

## 2022-05-01 NOTE — Patient Instructions (Addendum)
F/u in 4 months, call if you need me sooner  Right outer ear slightly inflamed, topical ear drops prescribed   Please schedule mammogram at checkout  Left leg swelling and discomfort, may be a lipoma, [place] of area is ordered   Fasting ;labs are over due , so please get this today  Congrats on weight loss through change in food choice and beverage, keep this up!  It is important that you exercise regularly at least 30 minutes 5 times a week. If you develop chest pain, have severe difficulty breathing, or feel very tired, stop exercising immediately and seek medical attention  Thanks for choosing [facility], we consider it a privelige to serve you.  Thanks for choosing [facility], we consider it a privelige to serve you.

## 2022-05-01 NOTE — Assessment & Plan Note (Signed)
Improved, congratulated on this and is encouraged to keep it up  Patient re-educated about  the importance of commitment to a  minimum of 150 minutes of exercise per week as able.  The importance of healthy food choices with portion control discussed, as well as eating regularly and within a 12 hour window most days. The need to choose "clean , green" food 50 to 75% of the time is discussed, as well as to make water the primary drink and set a goal of 64 ounces water daily.       05/01/2022    1:52 PM 12/22/2021    2:34 PM 11/07/2021   11:06 AM  Weight /BMI  Weight 232 lb 243 lb 234 lb  Height '5\' 6"'$  (1.676 m) '5\' 5"'$  (1.651 m) '5\' 5"'$  (1.651 m)  BMI 37.45 kg/m2 40.44 kg/m2 38.94 kg/m2

## 2022-05-02 ENCOUNTER — Other Ambulatory Visit (HOSPITAL_COMMUNITY): Payer: Self-pay | Admitting: Family Medicine

## 2022-05-02 DIAGNOSIS — Z1231 Encounter for screening mammogram for malignant neoplasm of breast: Secondary | ICD-10-CM

## 2022-05-02 IMAGING — US US PELVIS COMPLETE WITH TRANSVAGINAL
1 series · 13 of 25 positions shown · non-contrast
Comparison: None

CLINICAL DATA: Follow-up abnormal CT, abnormal endometrial complex,
LMP 12/29/2020

EXAM:
TRANSABDOMINAL AND TRANSVAGINAL ULTRASOUND OF PELVIS
TECHNIQUE: Both transabdominal and transvaginal ultrasound examinations of the
pelvis were performed. Transabdominal technique was performed for
global imaging of the pelvis including uterus, ovaries, adnexal
regions, and pelvic cul-de-sac. It was necessary to proceed with
endovaginal exam following the transabdominal exam to visualize the
endometrium and ovaries.

[Series 1: us pelvic complete with transvaginal · 13 of 116 slices shown]
[im 1/116]
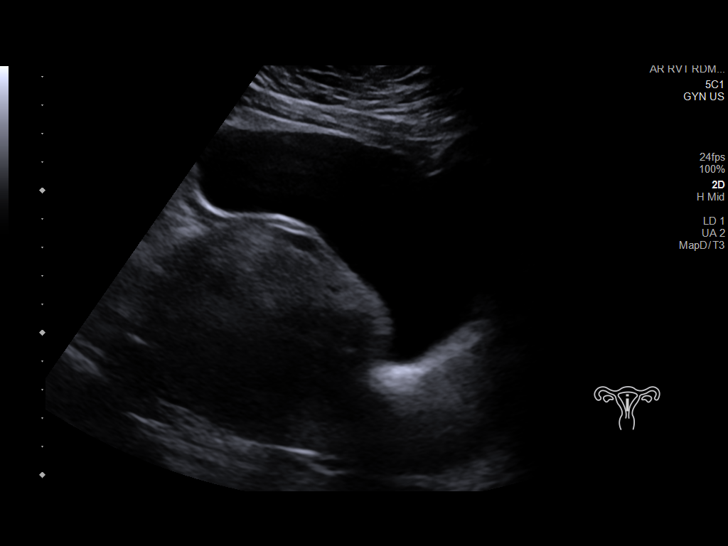
[im 10/116]
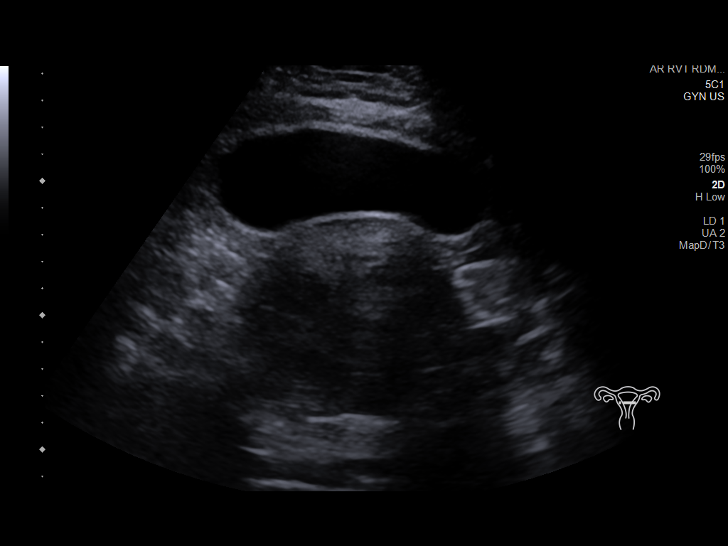
[im 20/116]
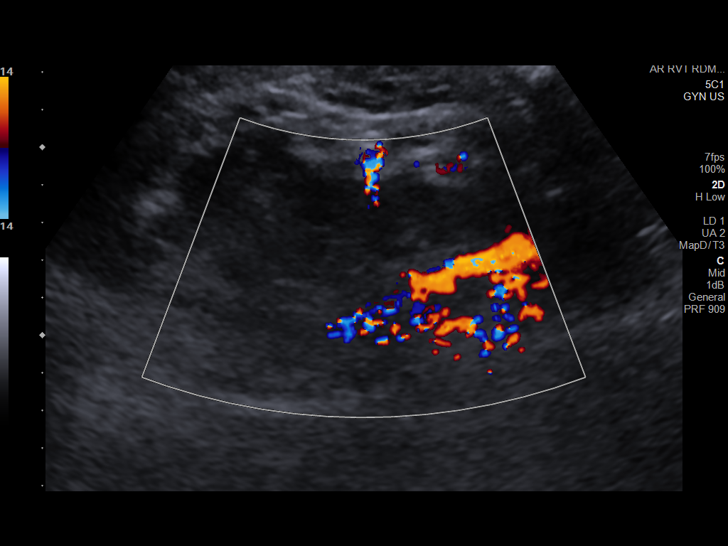
[im 29/116]
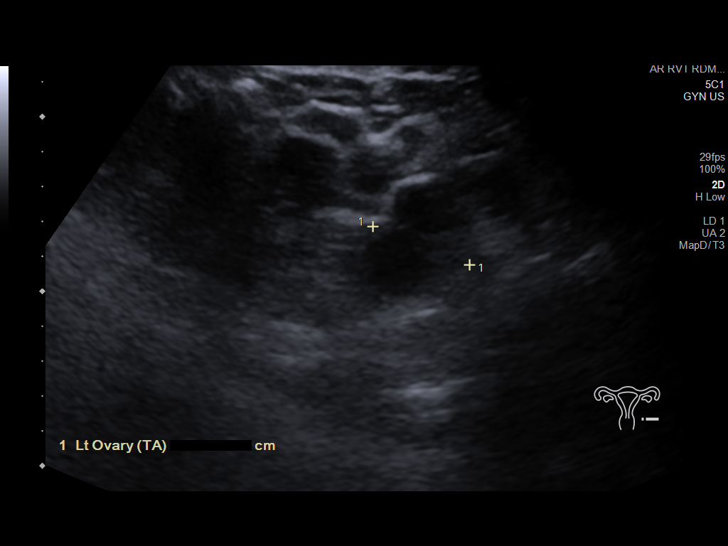
[im 39/116]
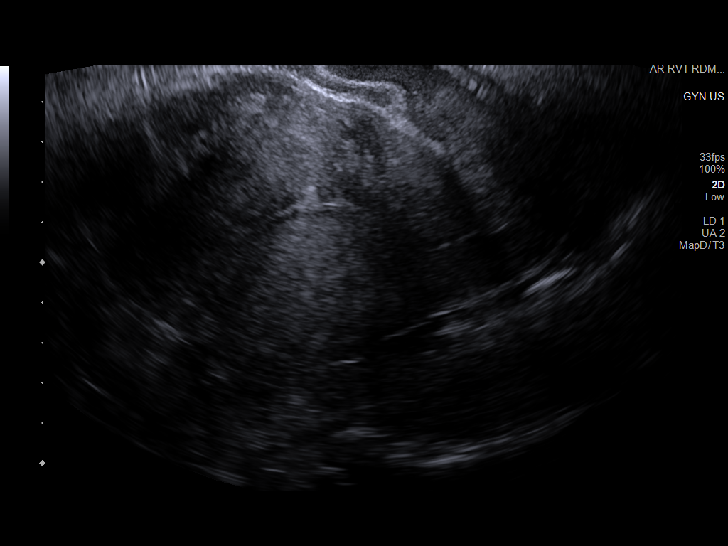
[im 48/116]
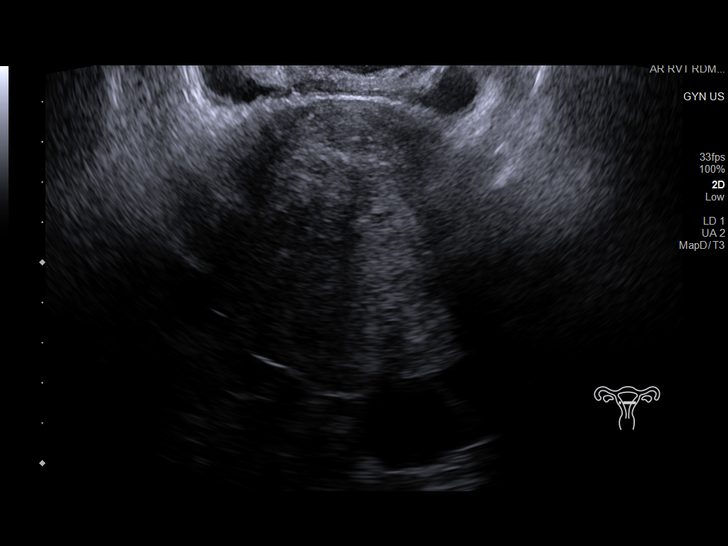
[im 58/116]
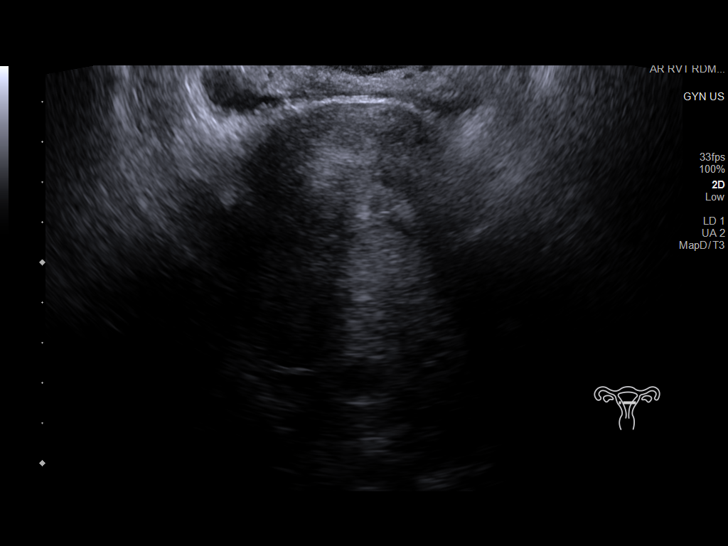
[im 68/116]
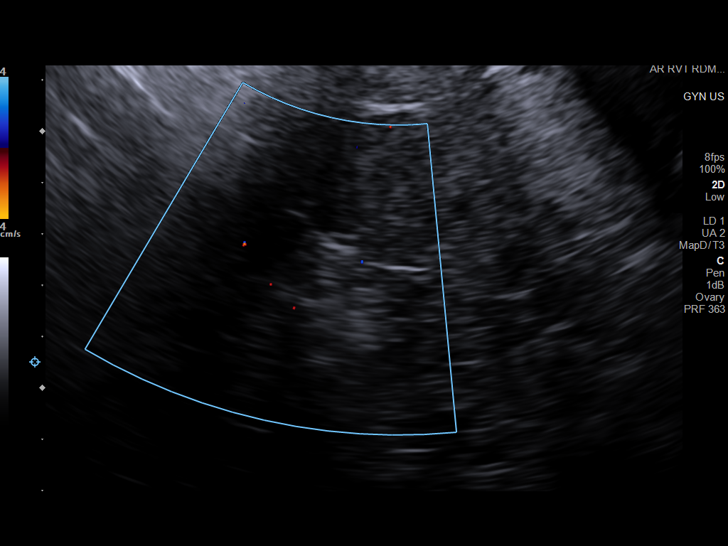
[im 77/116]
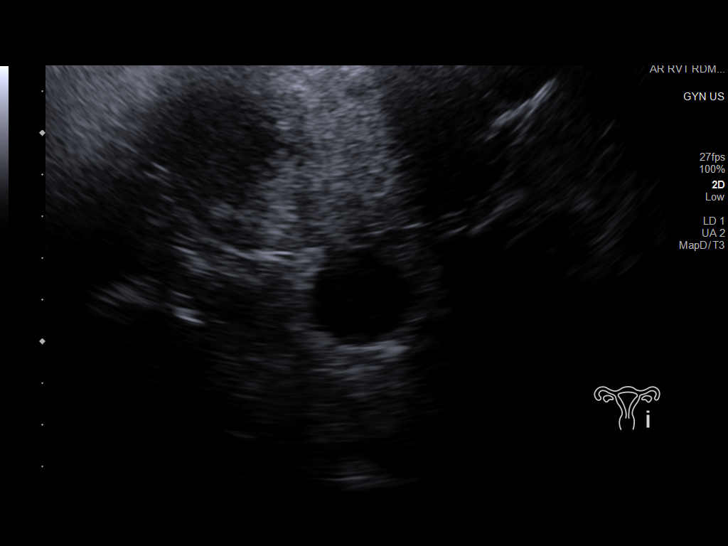
[im 87/116]
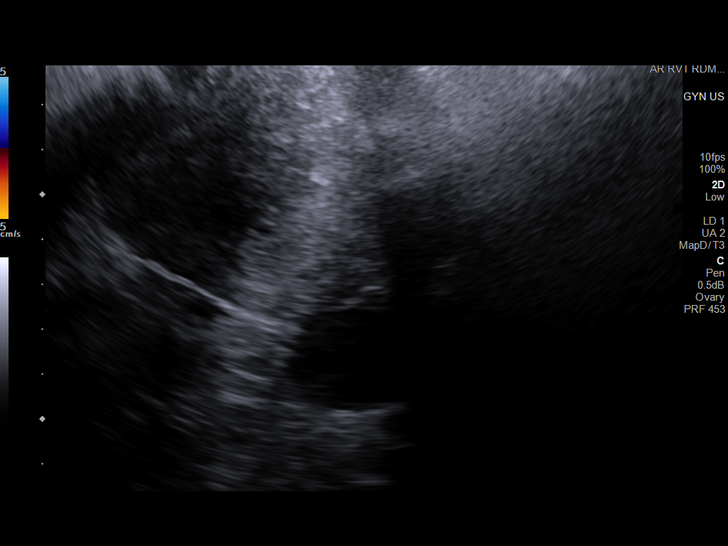
[im 96/116]
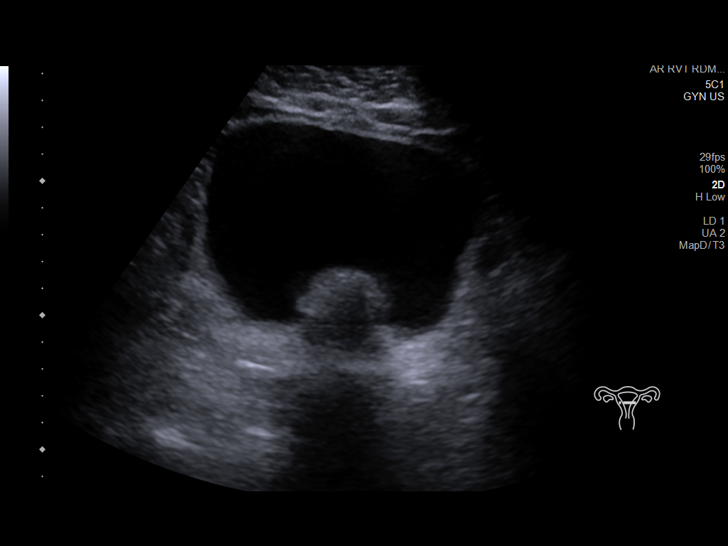
[im 106/116]
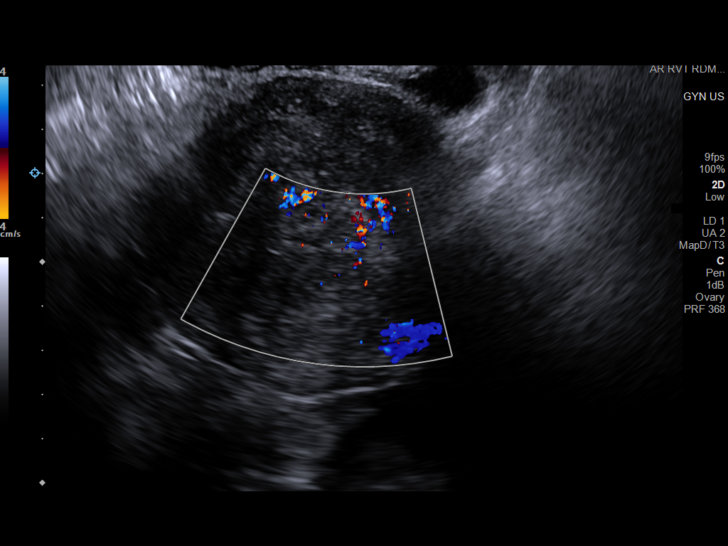
[im 116/116]
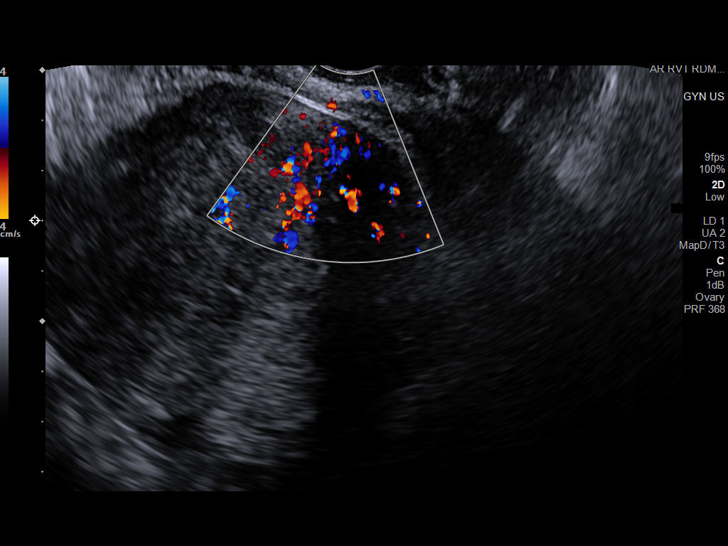

[13 of 25 positions shown; findings below may reference images not displayed]

FINDINGS: Uterus

Measurements: 13.9 x 7.3 x 7.9 cm = volume: 416 mL. Anteverted.
Heterogeneous myometrium. At least 4 uterine leiomyomata are
identified. Intramural fundal leiomyoma 3.0 cm. Upper LEFT fundal
intramural leiomyoma 2.9 cm. Anterior submucosal leiomyoma 3.7 cm.
Anterior subserosal leiomyoma 2.4 cm.

Endometrium

Thickness: 6 mm. No definite endometrial fluid or mass. The area of
abnormal masslike polypoid thickening of the endometrium anteriorly
on CT corresponds to the 3.7 cm anterior submucosal leiomyoma seen
on ultrasound.

Right ovary

Measurements: 3.0 x 2.2 x 2.5 cm = volume: 8.6 mL. Normal morphology
without mass

Left ovary

Measurements: 4.6 x 3.1 x 4.3 cm = volume: 32.1 mL. Dominant
follicle 2.6 cm diameter without additional mass; No followup
imaging recommended. Note: This recommendation does not apply to
premenarchal patients or to those with increased risk (genetic,
family history, elevated tumor markers or other high-risk factors)
of ovarian cancer. Reference: Radiology [DATE]):[id number].

Other findings

No free pelvic fluid.  No adnexal masses.
IMPRESSION: Multiple uterine leiomyomata.

No definite endometrial abnormalities identified.

Remainder of exam unremarkable.

Specifically, the polypoid masslike endometrial abnormality on CT
corresponds to a 3.7 cm diameter submucosal leiomyoma on ultrasound.

## 2022-05-03 ENCOUNTER — Other Ambulatory Visit: Payer: Self-pay | Admitting: Family Medicine

## 2022-05-03 LAB — CBC
Hematocrit: 37.2 % (ref 34.0–46.6)
Hemoglobin: 12.1 g/dL (ref 11.1–15.9)
MCH: 27.8 pg (ref 26.6–33.0)
MCHC: 32.5 g/dL (ref 31.5–35.7)
MCV: 85 fL (ref 79–97)
Platelets: 364 10*3/uL (ref 150–450)
RBC: 4.36 x10E6/uL (ref 3.77–5.28)
RDW: 12 % (ref 11.7–15.4)
WBC: 5.9 10*3/uL (ref 3.4–10.8)

## 2022-05-03 LAB — LIPID PANEL
Chol/HDL Ratio: 3.7 ratio (ref 0.0–4.4)
Cholesterol, Total: 175 mg/dL (ref 100–199)
HDL: 47 mg/dL (ref >39)
LDL Chol Calc (NIH): 114 mg/dL — ABNORMAL HIGH (ref 0–99)
Triglycerides: 74 mg/dL (ref 0–149)
VLDL Cholesterol Cal: 14 mg/dL (ref 5–40)

## 2022-05-03 LAB — CMP14+EGFR
ALT: 16 IU/L (ref 0–32)
AST: 20 IU/L (ref 0–40)
Albumin/Globulin Ratio: 1.8 (ref 1.2–2.2)
Albumin: 4.5 g/dL (ref 3.8–4.9)
Alkaline Phosphatase: 95 IU/L (ref 44–121)
BUN/Creatinine Ratio: 9 (ref 9–23)
BUN: 8 mg/dL (ref 6–24)
Bilirubin Total: 0.4 mg/dL (ref 0.0–1.2)
CO2: 24 mmol/L (ref 20–29)
Calcium: 9.6 mg/dL (ref 8.7–10.2)
Chloride: 105 mmol/L (ref 96–106)
Creatinine, Ser: 0.86 mg/dL (ref 0.57–1.00)
Globulin, Total: 2.5 g/dL (ref 1.5–4.5)
Glucose: 85 mg/dL (ref 70–99)
Potassium: 4.2 mmol/L (ref 3.5–5.2)
Sodium: 143 mmol/L (ref 134–144)
Total Protein: 7 g/dL (ref 6.0–8.5)
eGFR: 81 mL/min/{1.73_m2} (ref >59)

## 2022-05-03 LAB — TSH: TSH: 1.38 u[IU]/mL (ref 0.450–4.500)

## 2022-05-03 LAB — VITAMIN D 25 HYDROXY (VIT D DEFICIENCY, FRACTURES): Vit D, 25-Hydroxy: 27.3 ng/mL — ABNORMAL LOW (ref 30.0–100.0)

## 2022-05-09 ENCOUNTER — Ambulatory Visit (HOSPITAL_COMMUNITY): Payer: Commercial Managed Care - PPO

## 2022-05-10 ENCOUNTER — Ambulatory Visit (HOSPITAL_COMMUNITY): Payer: Commercial Managed Care - PPO

## 2022-05-16 ENCOUNTER — Ambulatory Visit (HOSPITAL_COMMUNITY)
Admission: RE | Admit: 2022-05-16 | Discharge: 2022-05-16 | Disposition: A | Discharge status: Home / Self Care | Payer: Commercial Managed Care - PPO | Source: Ambulatory Visit | Attending: Family Medicine | Admitting: Family Medicine

## 2022-05-16 DIAGNOSIS — M79605 Pain in left leg: Secondary | ICD-10-CM | POA: Diagnosis present

## 2022-05-19 ENCOUNTER — Other Ambulatory Visit: Payer: Self-pay | Admitting: Family Medicine

## 2022-06-05 ENCOUNTER — Other Ambulatory Visit: Payer: Self-pay | Admitting: Family Medicine

## 2022-09-04 ENCOUNTER — Ambulatory Visit: Payer: Commercial Managed Care - PPO | Admitting: Family Medicine

## 2022-09-05 ENCOUNTER — Other Ambulatory Visit: Payer: Self-pay | Admitting: Family Medicine

## 2022-10-16 ENCOUNTER — Ambulatory Visit: Payer: Commercial Managed Care - PPO | Admitting: Family Medicine

## 2022-12-20 ENCOUNTER — Ambulatory Visit: Payer: 59 | Admitting: Family Medicine

## 2022-12-20 ENCOUNTER — Encounter: Payer: Self-pay | Admitting: Family Medicine

## 2022-12-20 VITALS — BP 140/90 | HR 83 | Ht 66.0 in | Wt 231.0 lb

## 2022-12-20 DIAGNOSIS — I10 Essential (primary) hypertension: Secondary | ICD-10-CM | POA: Diagnosis not present

## 2022-12-20 DIAGNOSIS — E669 Obesity, unspecified: Secondary | ICD-10-CM | POA: Diagnosis not present

## 2022-12-20 DIAGNOSIS — K219 Gastro-esophageal reflux disease without esophagitis: Secondary | ICD-10-CM | POA: Diagnosis not present

## 2022-12-20 NOTE — Patient Instructions (Addendum)
F/u  in   4 months, call if you need me before  Come for shingrix 2 in next 4 weeks pls  Change food choice and eating habits consistently  Need to take meds every day   I will refer you for pap  Please get your mammogram   Twelve pounds off please  Zepbound and Wegovy are the 2 new approved meds for weight loss, check your ins re coverage  It is important that you exercise regularly at least 30 minutes 5 times a week. If you develop chest pain, have severe difficulty breathing, or feel very tired, stop exercising immediately and seek medical attention  Thanks for choosing [facility], we consider it a privelige to serve you.

## 2022-12-21 ENCOUNTER — Other Ambulatory Visit: Payer: Self-pay

## 2022-12-21 ENCOUNTER — Encounter: Payer: Self-pay | Admitting: Family Medicine

## 2022-12-21 DIAGNOSIS — Z124 Encounter for screening for malignant neoplasm of cervix: Secondary | ICD-10-CM

## 2022-12-21 NOTE — Progress Notes (Signed)
Diamond Le     MRN: [id number]      DOB: 11/22/1966   HPI Diamond Le is here for follow up and re-evaluation of chronic medical conditions, medication management and review of any available recent lab and radiology data.  Preventive health is updated, specifically  Cancer screening and Immunization.  Gaps present in both Has been off BP med for 2 days, just has not collected it!cst not an issue. The PT denies any adverse reactions to current medications since the last visit.  Weight gsin continues, still snacking and drinking sodas ROS Denies recent fever or chills. Denies sinus pressure, nasal congestion, ear pain or sore throat. Denies chest congestion, productive cough or wheezing. Denies chest pains, palpitations and leg swelling Denies abdominal pain, nausea, vomiting,diarrhea or constipation.   Denies dysuria, frequency, hesitancy or incontinence. Denies joint pain, swelling and limitation in mobility. Denies headaches, seizures, numbness, or tingling. Denies depression, anxiety or insomnia. Denies skin break down or rash.   PE  BP (!) 140/90   Pulse 83   Ht '5\' 6"'$  (1.676 m)   Wt 231 lb 0.6 oz (104.8 kg)   SpO2 93%   BMI 37.29 kg/m   Patient alert and oriented and in no cardiopulmonary distress.  HEENT: No facial asymmetry, EOMI,     Neck supple .  Chest: Clear to auscultation bilaterally.  CVS: S1, S2 no murmurs, no S3.Regular rate.  ABD: Soft non tender.   Ext: No edema  MS: Adequate ROM spine, shoulders, hips and knees.  Skin: Intact, no ulcerations or rash noted.  Psych: Good eye contact, normal affect. Memory intact not anxious or depressed appearing.  CNS: CN 2-12 intact, power,  normal throughout.no focal deficits noted.   Assessment & Plan  Essential hypertension Uncontrolled,  non compliant DASH diet and commitment to daily physical activity for a minimum of 30 minutes discussed and encouraged, as a part of hypertension management. The  importance of attaining a healthy weight is also discussed.     12/20/2022    4:40 PM 12/20/2022    4:28 PM 12/20/2022    4:25 PM 05/01/2022    1:52 PM 02/11/2022    9:04 AM 12/22/2021    2:34 PM 11/07/2021   11:06 AM  BP/Weight  Systolic BP 063 016 010 932 355 732 202  Diastolic BP 90 86 95 79 84 84 74  Wt. (Lbs)   231.04 232  243 234  BMI   37.29 kg/m2 37.45 kg/m2  40.44 kg/m2 38.94 kg/m2       Obesity (BMI 30-39.9) Deteriorated   Patient re-educated about  the importance of commitment to a  minimum of 150 minutes of exercise per week as able.  The importance of healthy food choices with portion control discussed, as well as eating regularly and within a 12 hour window most days. The need to choose "clean , green" food 50 to 75% of the time is discussed, as well as to make water the primary drink and set a goal of 64 ounces water daily.       12/20/2022    4:25 PM 05/01/2022    1:52 PM 12/22/2021    2:34 PM  Weight /BMI  Weight 231 lb 0.6 oz 232 lb 243 lb  Height '5\' 6"'$  (1.676 m) '5\' 6"'$  (1.676 m) '5\' 5"'$  (1.651 m)  BMI 37.29 kg/m2 37.45 kg/m2 40.44 kg/m2      GERD (gastroesophageal reflux disease) Needs to remain on daily med, encouraged to  d/c sodas, drinks 3 / day

## 2022-12-21 NOTE — Assessment & Plan Note (Signed)
Uncontrolled,  non compliant DASH diet and commitment to daily physical activity for a minimum of 30 minutes discussed and encouraged, as a part of hypertension management. The importance of attaining a healthy weight is also discussed.     12/20/2022    4:40 PM 12/20/2022    4:28 PM 12/20/2022    4:25 PM 05/01/2022    1:52 PM 02/11/2022    9:04 AM 12/22/2021    2:34 PM 11/07/2021   11:06 AM  BP/Weight  Systolic BP 664 403 474 259 [phone]  Diastolic BP 90 86 95 79 84 84 74  Wt. (Lbs)   231.04 232  243 234  BMI   37.29 kg/m2 37.45 kg/m2  40.44 kg/m2 38.94 kg/m2

## 2022-12-21 NOTE — Assessment & Plan Note (Signed)
Needs to remain on daily med, encouraged to d/c sodas, drinks 3 / day

## 2022-12-21 NOTE — Assessment & Plan Note (Signed)
Deteriorated   Patient re-educated about  the importance of commitment to a  minimum of 150 minutes of exercise per week as able.  The importance of healthy food choices with portion control discussed, as well as eating regularly and within a 12 hour window most days. The need to choose "clean , green" food 50 to 75% of the time is discussed, as well as to make water the primary drink and set a goal of 64 ounces water daily.       12/20/2022    4:25 PM 05/01/2022    1:52 PM 12/22/2021    2:34 PM  Weight /BMI  Weight 231 lb 0.6 oz 232 lb 243 lb  Height '5\' 6"'$  (1.676 m) '5\' 6"'$  (1.676 m) '5\' 5"'$  (1.651 m)  BMI 37.29 kg/m2 37.45 kg/m2 40.44 kg/m2

## 2023-01-08 ENCOUNTER — Telehealth: Payer: Self-pay | Admitting: Family Medicine

## 2023-01-08 NOTE — Telephone Encounter (Signed)
Patient was told to call Dr Moshe Cipro, her blood pressure was high again to call. she on the phone last checked was 166/97.

## 2023-01-10 NOTE — Telephone Encounter (Signed)
Called patient will recheck and give [place] a call if needs to come in sooner.

## 2023-01-21 ENCOUNTER — Other Ambulatory Visit: Payer: Self-pay | Admitting: Family Medicine

## 2023-01-24 ENCOUNTER — Encounter: Payer: Self-pay | Admitting: Radiology

## 2023-02-20 ENCOUNTER — Ambulatory Visit: Payer: 59 | Admitting: Adult Health

## 2023-03-15 ENCOUNTER — Other Ambulatory Visit: Payer: Self-pay | Admitting: Family Medicine

## 2023-04-04 ENCOUNTER — Ambulatory Visit (INDEPENDENT_AMBULATORY_CARE_PROVIDER_SITE_OTHER): Payer: 59 | Admitting: Adult Health

## 2023-04-04 ENCOUNTER — Other Ambulatory Visit (HOSPITAL_COMMUNITY)
Admission: RE | Admit: 2023-04-04 | Discharge: 2023-04-04 | Disposition: A | Discharge status: Home / Self Care | Payer: 59 | Source: Ambulatory Visit | Attending: Adult Health | Admitting: Adult Health

## 2023-04-04 ENCOUNTER — Encounter: Payer: Self-pay | Admitting: Adult Health

## 2023-04-04 VITALS — BP 134/82 | HR 82 | Ht 65.0 in | Wt 230.0 lb

## 2023-04-04 DIAGNOSIS — Z01419 Encounter for gynecological examination (general) (routine) without abnormal findings: Secondary | ICD-10-CM | POA: Insufficient documentation

## 2023-04-04 DIAGNOSIS — Z1211 Encounter for screening for malignant neoplasm of colon: Secondary | ICD-10-CM

## 2023-04-04 LAB — HEMOCCULT GUIAC POC 1CARD (OFFICE): Fecal Occult Blood, POC: NEGATIVE

## 2023-04-04 NOTE — Progress Notes (Signed)
Patient ID: Diamond Le, female   DOB: 19-Jan-1966, 57 y.o.   MRN: [id number] History of Present Illness: Arlayne is a 57 year old black female, married, G2P2002 in for a well woman gyn exam and pap. Last period was June 2023.  PCP is Dr Lodema Hong   Current Medications, Allergies, Past Medical History, Past Surgical History, Family History and Social History were reviewed in [employer] electronic medical record.     Review of Systems: Patient denies any headaches, hearing loss, fatigue, blurred vision, shortness of breath, chest pain, abdominal pain, problems with bowel movements, urination, or intercourse. No joint pain or mood swings.     Physical Exam:BP 134/82 (BP Location: Left Arm, Patient Position: Sitting, Cuff Size: Large)   Pulse 82   Ht 5\' 5"  (1.651 m)   Wt 230 lb (104.3 kg)   BMI 38.27 kg/m   last period June 2023 General:  Well developed, well nourished, no acute distress Skin:  Warm and dry Neck:  Midline trachea, normal thyroid, good ROM, no lymphadenopathy Lungs; Clear to auscultation bilaterally Breast:  No dominant palpable mass, retraction, or nipple discharge,sp breast reduction with well healed scars  Cardiovascular: Regular rate and rhythm Abdomen:  Soft, non tender, no hepatosplenomegaly Pelvic:  External genitalia is normal in appearance, no lesions.  The vagina is normal in appearance. Urethra has no lesions or masses. The cervix is bulbous. Pap with HR HPV genotyping performed. Uterus is felt to be normal size, shape, and contour.  No adnexal masses or tenderness noted.Bladder is non tender, no masses felt. Rectal: Good sphincter tone, no polyps, or hemorrhoids felt.  Hemoccult negative. Extremities/musculoskeletal:  No swelling or varicosities noted, no clubbing or cyanosis Psych:  No mood changes, alert and cooperative,seems happy AA is 1 Fall risk is low    04/04/2023    3:46 PM 12/20/2022    4:26 PM 05/01/2022    1:53 PM  Depression screen PHQ 2/9   Decreased Interest 0 0 0  Down, Depressed, Hopeless 0 0 0  PHQ - 2 Score 0 0 0  Altered sleeping 0 0   Tired, decreased energy 0 0   Change in appetite 0 0   Feeling bad or failure about yourself  0 0   Trouble concentrating 0 0   Moving slowly or fidgety/restless 0 0   Suicidal thoughts 0 0   PHQ-9 Score 0 0   Difficult doing work/chores  Not difficult at all        04/04/2023    3:46 PM 12/20/2022    4:27 PM 02/06/2021    1:43 PM  GAD 7 : Generalized Anxiety Score  Nervous, Anxious, on Edge 0 0 0  Control/stop worrying 0 0 0  Worry too much - different things 0 0 0  Trouble relaxing 0 0 0  Restless 0 0 0  Easily annoyed or irritable 0 0 0  Afraid - awful might happen 0 0 0  Total GAD 7 Score 0 0 0  Anxiety Difficulty  Not difficult at all     Upstream - 04/04/23 1549       Pregnancy Intention Screening   Does the patient want to become pregnant in the next year? N/A    Does the patient's partner want to become pregnant in the next year? N/A    Would the patient like to discuss contraceptive options today? N/A      Contraception Wrap Up   Current Method No Method - Other Reason  Reason for No Current Contraceptive Method at Intake ([facility] Only) Other    End Method No Method - Other Reason    Contraception Counseling Provided No            Examination chaperoned by Malachy Mood LPN     Impression and Plan: 1. Encounter for gynecological examination with Papanicolaou smear of cervix Pap sent Pap in 3 years if normal Physical with PCP Labs with PCP Colonoscopy per GI Get mammogram   - Cytology - PAP( ) If see has gone a year with no period and sees blood, let me know  2. Encounter for screening fecal occult blood testing Hemoccult was negative  - POCT occult blood stool

## 2023-04-08 LAB — CYTOLOGY - PAP
Comment: NEGATIVE
Diagnosis: NEGATIVE
High risk HPV: NEGATIVE

## 2023-04-27 ENCOUNTER — Other Ambulatory Visit: Payer: Self-pay | Admitting: Family Medicine

## 2023-05-08 ENCOUNTER — Ambulatory Visit: Payer: 59 | Admitting: Family Medicine

## 2023-08-10 ENCOUNTER — Other Ambulatory Visit: Payer: Self-pay | Admitting: Family Medicine

## 2023-08-22 ENCOUNTER — Other Ambulatory Visit: Payer: Self-pay | Admitting: Family Medicine

## 2023-08-23 ENCOUNTER — Other Ambulatory Visit: Payer: Self-pay | Admitting: Family Medicine

## 2023-08-23 DIAGNOSIS — Z1231 Encounter for screening mammogram for malignant neoplasm of breast: Secondary | ICD-10-CM

## 2023-08-29 ENCOUNTER — Ambulatory Visit: Payer: 59 | Admitting: Family Medicine

## 2023-08-29 ENCOUNTER — Encounter: Payer: Self-pay | Admitting: Family Medicine

## 2023-08-29 VITALS — BP 116/77 | HR 73 | Ht 65.0 in | Wt 228.0 lb

## 2023-08-29 DIAGNOSIS — I10 Essential (primary) hypertension: Secondary | ICD-10-CM

## 2023-08-29 DIAGNOSIS — M79605 Pain in left leg: Secondary | ICD-10-CM

## 2023-08-29 DIAGNOSIS — Z13220 Encounter for screening for lipoid disorders: Secondary | ICD-10-CM | POA: Diagnosis not present

## 2023-08-29 DIAGNOSIS — E785 Hyperlipidemia, unspecified: Secondary | ICD-10-CM

## 2023-08-29 DIAGNOSIS — E559 Vitamin D deficiency, unspecified: Secondary | ICD-10-CM | POA: Diagnosis not present

## 2023-08-29 MED ORDER — MELOXICAM 7.5 MG PO TABS
ORAL_TABLET | ORAL | 1 refills | Status: DC
Start: 2023-08-29 — End: 2023-09-10

## 2023-08-29 NOTE — Patient Instructions (Addendum)
F/U IN 4 MONTHS, CALL IF YOU NEED ME SOONER  NEW FOR JOINT AND FOOT PAIN AS NEEDED IS MELOXICAM ONE DAILY  EXCELLENT BP AND WEIGHT LOSS, KEEP IT UP  It is important that you exercise regularly at least 30 minutes 5 times a week. If you develop chest pain, have severe difficulty breathing, or feel very tired, stop exercising immediately and seek medical attention    FASTING CBC, LIPID, CMP AND EGFR, HBA1C, TSH AND VIT D IN NEXT 1 WEEK  PLEASE ARRANGE TO GET [facility] AND TDAP WITH NURSE WHEN YOU ARE READY  Thanks for choosing [facility], we consider it a privelige to serve you.

## 2023-08-30 ENCOUNTER — Encounter: Payer: Self-pay | Admitting: Family Medicine

## 2023-08-30 DIAGNOSIS — E785 Hyperlipidemia, unspecified: Secondary | ICD-10-CM | POA: Insufficient documentation

## 2023-08-30 MED ORDER — AMLODIPINE BESYLATE 10 MG PO TABS: 10.0000 mg | ORAL_TABLET | Freq: Every day | ORAL | 5 refills | Status: DC

## 2023-08-30 NOTE — Assessment & Plan Note (Signed)
Hyperlipidemia:Low fat diet discussed and encouraged.   Lipid Panel  Lab Results  Component Value Date   CHOL 175 05/01/2022   HDL 47 05/01/2022   LDLCALC 114 (H) 05/01/2022   TRIG 74 05/01/2022   CHOLHDL 3.7 05/01/2022     Updated lab needed at/ before next visit.

## 2023-08-30 NOTE — Assessment & Plan Note (Signed)
Updated lab needed at/ before next visit.   

## 2023-08-30 NOTE — Progress Notes (Signed)
Diamond Le     MRN: [id number]      DOB: 01-Jul-1966  Chief Complaint  Patient presents with   Follow-up    Follow up pain in L calf comes and goes x 2 weeks     HPI Diamond Le is here for follow up and re-evaluation of chronic medical conditions, medication management and review of any available recent lab and radiology data.  Preventive health is updated, specifically  Cancer screening and Immunization.   Recurrent left calf pain and swelling, also pain behind left knee most recently, tylenol not very beneficial Has cut out sweets and starches with good effect on weight , will continue same approach, no meds  ROS Denies recent fever or chills. Denies sinus pressure, nasal congestion, ear pain or sore throat. Denies chest congestion, productive cough or wheezing. Denies chest pains, palpitations and leg swelling Denies abdominal pain, nausea, vomiting,diarrhea or constipation.   Denies dysuria, frequency, hesitancy or incontinence.  Denies headaches, seizures, numbness, or tingling. Denies depression, anxiety or insomnia. Denies skin break down or rash.   PE  BP 116/77 (BP Location: Right Arm, Patient Position: Sitting, Cuff Size: Large)   Pulse 73   Ht 5\' 5"  (1.651 m)   Wt 228 lb 0.6 oz (103.4 kg)   SpO2 96%   BMI 37.95 kg/m   Patient alert and oriented and in no cardiopulmonary distress.  HEENT: No facial asymmetry, EOMI,     Neck supple .  Chest: Clear to auscultation bilaterally.  CVS: S1, S2 no murmurs, no S3.Regular rate.  ABD: Soft non tender.   Ext: No edema  MS: Adequate ROM spine, shoulders, hips and knees.left calf bigger than right, chronic, no redness ,warmth or tenderness  Skin: Intact, no ulcerations or rash noted.  Psych: Good eye contact, normal affect. Memory intact not anxious or depressed appearing.  CNS: CN 2-12 intact, power,  normal throughout.no focal deficits noted.   Assessment & Plan  Essential hypertension Controlled, no  change in medication DASH diet and commitment to daily physical activity for a minimum of 30 minutes discussed and encouraged, as a part of hypertension management. The importance of attaining a healthy weight is also discussed.     08/29/2023   10:57 AM 04/04/2023    3:46 PM 12/20/2022    4:40 PM 12/20/2022    4:28 PM 12/20/2022    4:25 PM 05/01/2022    1:52 PM 02/11/2022    9:04 AM  BP/Weight  Systolic BP 116 134 140 142 159 131 135  Diastolic BP 77 82 90 86 95 79 84  Wt. (Lbs) 228.04 230   231.04 232   BMI 37.95 kg/m2 38.27 kg/m2   37.29 kg/m2 37.45 kg/m2        Morbid obesity due to excess calories (HCC) Improved  Patient re-educated about  the importance of commitment to a  minimum of 150 minutes of exercise per week as able.  The importance of healthy food choices with portion control discussed, as well as eating regularly and within a 12 hour window most days. The need to choose "clean , green" food 50 to 75% of the time is discussed, as well as to make water the primary drink and set a goal of 64 ounces water daily.       08/29/2023   10:57 AM 04/04/2023    3:46 PM 12/20/2022    4:25 PM  Weight /BMI  Weight 228 lb 0.6 oz 230 lb 231 lb 0.6 oz  Height 5\' 5"  (1.651 m) 5\' 5"  (1.651 m) 5\' 6"  (1.676 m)  BMI 37.95 kg/m2 38.27 kg/m2 37.29 kg/m2      Left leg pain Chronic recurrent , meloxicam as needed  Vitamin D deficiency Updated lab needed at/ before next visit.   Dyslipidemia (high LDL; low HDL) Hyperlipidemia:Low fat diet discussed and encouraged.   Lipid Panel  Lab Results  Component Value Date   CHOL 175 05/01/2022   HDL 47 05/01/2022   LDLCALC 114 (H) 05/01/2022   TRIG 74 05/01/2022   CHOLHDL 3.7 05/01/2022     Updated lab needed at/ before next visit.

## 2023-08-30 NOTE — Assessment & Plan Note (Signed)
Chronic recurrent , meloxicam as needed

## 2023-08-30 NOTE — Assessment & Plan Note (Signed)
Improved  Patient re-educated about  the importance of commitment to a  minimum of 150 minutes of exercise per week as able.  The importance of healthy food choices with portion control discussed, as well as eating regularly and within a 12 hour window most days. The need to choose "clean , green" food 50 to 75% of the time is discussed, as well as to make water the primary drink and set a goal of 64 ounces water daily.       08/29/2023   10:57 AM 04/04/2023    3:46 PM 12/20/2022    4:25 PM  Weight /BMI  Weight 228 lb 0.6 oz 230 lb 231 lb 0.6 oz  Height 5\' 5"  (1.651 m) 5\' 5"  (1.651 m) 5\' 6"  (1.676 m)  BMI 37.95 kg/m2 38.27 kg/m2 37.29 kg/m2

## 2023-08-30 NOTE — Assessment & Plan Note (Signed)
Controlled, no change in medication DASH diet and commitment to daily physical activity for a minimum of 30 minutes discussed and encouraged, as a part of hypertension management. The importance of attaining a healthy weight is also discussed.     08/29/2023   10:57 AM 04/04/2023    3:46 PM 12/20/2022    4:40 PM 12/20/2022    4:28 PM 12/20/2022    4:25 PM 05/01/2022    1:52 PM 02/11/2022    9:04 AM  BP/Weight  Systolic BP 116 134 140 142 159 131 135  Diastolic BP 77 82 90 86 95 79 84  Wt. (Lbs) 228.04 230   231.04 232   BMI 37.95 kg/m2 38.27 kg/m2   37.29 kg/m2 37.45 kg/m2

## 2023-09-10 ENCOUNTER — Ambulatory Visit: Payer: 59 | Admitting: Adult Health

## 2023-09-10 ENCOUNTER — Encounter: Payer: Self-pay | Admitting: Adult Health

## 2023-09-10 VITALS — BP 120/75 | HR 78 | Ht 65.0 in | Wt 227.0 lb

## 2023-09-10 DIAGNOSIS — N950 Postmenopausal bleeding: Secondary | ICD-10-CM | POA: Diagnosis not present

## 2023-09-10 NOTE — Progress Notes (Addendum)
  Subjective:     Patient ID: Diamond Le, female   DOB: 1966/09/02, 57 y.o.   MRN: [id number]  HPI Diamond Le is a 57 year old black female,married,PM in having had vaginal bleeding since Saturday, noticed breast tenderness and clear vaginal discharge in September. Feels bloated.     Component Value Date/Time   DIAGPAP  04/04/2023 1550    - Negative for intraepithelial lesion or malignancy (NILM)   DIAGPAP  12/10/2018 0000    NEGATIVE FOR INTRAEPITHELIAL LESIONS OR MALIGNANCY.   HPVHIGH Negative 04/04/2023 1550   ADEQPAP  04/04/2023 1550    Satisfactory for evaluation; transformation zone component PRESENT.   ADEQPAP  12/10/2018 0000    Satisfactory for evaluation  endocervical/transformation zone component PRESENT.    PCP is Dr Lodema Hong   Review of Systems + vaginal bleeding Had clear discharge and breast tenderness in September  Reviewed past medical,surgical, social and family history. Reviewed medications and allergies.     Objective:   Physical Exam BP 120/75 (BP Location: Left Arm, Patient Position: Sitting, Cuff Size: Normal)   Pulse 78   Ht 5\' 5"  (1.651 m)   Wt 227 lb (103 kg)   LMP 03/05/2021 (Approximate)   BMI 37.77 kg/m     Skin warm and dry.Pelvic: external genitalia is normal in appearance no lesions, vagina: +blood,urethra has no lesions or masses noted, cervix:smooth and bulbous, uterus: normal size, shape and contour, non tender, no masses felt, adnexa: no masses or tenderness noted. Bladder is non tender and no masses felt.   Upstream - 09/10/23 1510       Pregnancy Intention Screening   Does the patient want to become pregnant in the next year? N/A    Does the patient's partner want to become pregnant in the next year? N/A    Would the patient like to discuss contraceptive options today? N/A      Contraception Wrap Up   Current Method No Method - Other Reason   PM   Reason for No Current Contraceptive Method at Intake ([facility] Only) Other    End Method  No Method - Other Reason   PM   Contraception Counseling Provided No            Exam by [place] Scearce NP student, under my supervision.   Assessment:     1. PMB (postmenopausal bleeding) +vaginal bleeding Will get pelvic US to assess endometrium Pt aware may need endometrial biopsy if thickened  - US PELVIC COMPLETE WITH TRANSVAGINAL; Future     Plan:     Follow up in 6 days for pelvic US

## 2023-09-11 ENCOUNTER — Ambulatory Visit: Payer: 59 | Admitting: Family Medicine

## 2023-09-16 ENCOUNTER — Ambulatory Visit: Payer: 59

## 2023-09-16 DIAGNOSIS — N950 Postmenopausal bleeding: Secondary | ICD-10-CM | POA: Diagnosis not present

## 2023-09-16 NOTE — Progress Notes (Signed)
PELVIC US TA/TV: enlarged heterogeneous anteverted uterus with multiple fibroid,(#1) anterior ? submucosal fibroid 3.8 x 2.5 x 3.2 cm,(#2) post fundal left intramural fibroid 4.1 x 2.7 x 3.3 cm,(#3) anterior fundal left intramural fibroid 2.4 x 2.5 x 2.6 cm,EEC 6.3 mm,distorted thickened endometrium because of anterior fibroid,normal ovaries,ovaries appear mobile  Chaperone Tammy

## 2023-09-17 LAB — LIPID PANEL
Chol/HDL Ratio: 4 {ratio} (ref 0.0–4.4)
Cholesterol, Total: 184 mg/dL (ref 100–199)
HDL: 46 mg/dL (ref >39)
LDL Chol Calc (NIH): 123 mg/dL — ABNORMAL HIGH (ref 0–99)
Triglycerides: 80 mg/dL (ref 0–149)
VLDL Cholesterol Cal: 15 mg/dL (ref 5–40)

## 2023-09-17 LAB — CMP14+EGFR
ALT: 14 [IU]/L (ref 0–32)
AST: 18 [IU]/L (ref 0–40)
Albumin: 4.5 g/dL (ref 3.8–4.9)
Alkaline Phosphatase: 116 [IU]/L (ref 44–121)
BUN/Creatinine Ratio: 10 (ref 9–23)
BUN: 11 mg/dL (ref 6–24)
Bilirubin Total: 0.5 mg/dL (ref 0.0–1.2)
CO2: 25 mmol/L (ref 20–29)
Calcium: 9.8 mg/dL (ref 8.7–10.2)
Chloride: 103 mmol/L (ref 96–106)
Creatinine, Ser: 1.05 mg/dL — ABNORMAL HIGH (ref 0.57–1.00)
Globulin, Total: 2.6 g/dL (ref 1.5–4.5)
Glucose: 86 mg/dL (ref 70–99)
Potassium: 4.1 mmol/L (ref 3.5–5.2)
Sodium: 143 mmol/L (ref 134–144)
Total Protein: 7.1 g/dL (ref 6.0–8.5)
eGFR: 63 mL/min/{1.73_m2} (ref >59)

## 2023-09-17 LAB — CBC
Hematocrit: 39.3 % (ref 34.0–46.6)
Hemoglobin: 12.6 g/dL (ref 11.1–15.9)
MCH: 28.4 pg (ref 26.6–33.0)
MCHC: 32.1 g/dL (ref 31.5–35.7)
MCV: 89 fL (ref 79–97)
Platelets: 366 10*3/uL (ref 150–450)
RBC: 4.43 x10E6/uL (ref 3.77–5.28)
RDW: 11.6 % — ABNORMAL LOW (ref 11.7–15.4)
WBC: 4.9 10*3/uL (ref 3.4–10.8)

## 2023-09-17 LAB — HEMOGLOBIN A1C
Est. average glucose Bld gHb Est-mCnc: 94 mg/dL
Hgb A1c MFr Bld: 4.9 % (ref 4.8–5.6)

## 2023-09-17 LAB — VITAMIN D 25 HYDROXY (VIT D DEFICIENCY, FRACTURES): Vit D, 25-Hydroxy: 21.8 ng/mL — ABNORMAL LOW (ref 30.0–100.0)

## 2023-09-17 LAB — TSH: TSH: 1.54 u[IU]/mL (ref 0.450–4.500)

## 2023-09-18 ENCOUNTER — Telehealth: Payer: Self-pay | Admitting: Adult Health

## 2023-09-18 DIAGNOSIS — N950 Postmenopausal bleeding: Secondary | ICD-10-CM

## 2023-09-18 NOTE — Telephone Encounter (Signed)
Pt aware that US showed several fibroids and normal ovaries and has thickened endometrium at 6.3 mm will get endometrial with Dr Charlotta Newton

## 2023-09-18 NOTE — Telephone Encounter (Signed)
Left message to call me about US °

## 2023-09-19 MED ORDER — VITAMIN D (ERGOCALCIFEROL) 1.25 MG (50000 UNIT) PO CAPS: 50000.0000 [IU] | ORAL_CAPSULE | ORAL | 8 refills | Status: DC

## 2023-09-19 NOTE — Addendum Note (Signed)
Addended by: Kerri Perches on: 09/19/2023 08:01 AM   Modules accepted: Orders

## 2023-09-25 ENCOUNTER — Ambulatory Visit: Payer: 59 | Admitting: Obstetrics & Gynecology

## 2023-09-25 ENCOUNTER — Other Ambulatory Visit (HOSPITAL_COMMUNITY)
Admission: RE | Admit: 2023-09-25 | Discharge: 2023-09-25 | Disposition: A | Discharge status: Home / Self Care | Payer: 59 | Source: Ambulatory Visit | Attending: Obstetrics & Gynecology | Admitting: Obstetrics & Gynecology

## 2023-09-25 ENCOUNTER — Encounter: Payer: Self-pay | Admitting: Obstetrics & Gynecology

## 2023-09-25 VITALS — BP 132/80 | HR 79 | Ht 65.0 in | Wt 227.4 lb

## 2023-09-25 DIAGNOSIS — D251 Intramural leiomyoma of uterus: Secondary | ICD-10-CM | POA: Diagnosis not present

## 2023-09-25 DIAGNOSIS — N950 Postmenopausal bleeding: Secondary | ICD-10-CM | POA: Diagnosis present

## 2023-09-25 DIAGNOSIS — R9389 Abnormal findings on diagnostic imaging of other specified body structures: Secondary | ICD-10-CM | POA: Diagnosis present

## 2023-09-25 NOTE — Progress Notes (Signed)
GYN VISIT Patient name: Diamond Le MRN [id number]  Date of birth: 1966/04/18 Chief Complaint:   Endometrial biopsy  History of Present Illness:   Diamond Le is a 57 y.o. [phone] female being seen today for the following concerns:     PMB: Notes a clear/slimy discharge and then her period came on x 5 days.  She notes breast tenderness beginning of Oct.  Had spotting on 10/9 then had a "period" that started on 10/13. Bleeding has since stopped.    Prior to this last period was July 2023 and notes periods were every month up to until that date.    Seen by [name]; ultrasound completed that showed the following:  09/16/2023:   9.4 x 6.7 x 7 cm, Total uterine volume 231 MW:[id number] heterogeneous anteverted uterus with multiple fibroid,(#1) anterior ? submucosal fibroid 3.8 x 2.5 x 3.2 cm,(#2) post fundal left intramural fibroid 4.1 x 2.7 x 3.3 cm,(#3) anterior fundal left intramural fibroid 2.4 x 2.5 x 2.6 cm. Endometrium-6.3 mm Normal ovaries bilaterally    Patient's last menstrual period was 03/05/2021 (approximate).    Review of Systems:   Pertinent items are noted in HPI Denies fever/chills, dizziness, headaches, visual disturbances, fatigue, shortness of breath, chest pain, abdominal pain, vomiting, no problems with  bowel movements, urination, or intercourse unless otherwise stated above.  Pertinent History Reviewed:   Past Surgical History:  Procedure Laterality Date   BREAST CYST EXCISION Right 12/14/2016   BREAST REDUCTION SURGERY Bilateral 02/21/2021   Procedure: MAMMARY REDUCTION  (BREAST);  Surgeon: Allena Napoleon, MD;  Location: [facility];  Service: Plastics;  Laterality: Bilateral;   COLONOSCOPY WITH PROPOFOL N/A 06/27/2021   Procedure: COLONOSCOPY WITH PROPOFOL;  Surgeon: Dolores Frame, MD;  Location: [facility];  Service: Gastroenterology;  Laterality: N/A;  9:50   MASS EXCISION Right 12/14/2016   Procedure: EXCISION CYST  RIGHT AXILLA;  Surgeon: Franky Macho, MD;  Location: [facility];  Service: General;  Laterality: Right;   POLYPECTOMY  06/27/2021   Procedure: POLYPECTOMY;  Surgeon: Dolores Frame, MD;  Location: [facility];  Service: Gastroenterology;;    Past Medical History:  Diagnosis Date   Anemia    heavy flow   Folliculitis 04/19/2015   GERD (gastroesophageal reflux disease)    Hypertension    Macromastia    Obesity    Palpitation    Superficial fungus infection of skin 04/19/2015   Vitamin D deficiency    Reviewed problem list, medications and allergies. Physical Assessment:   Vitals:   09/25/23 1120  BP: 132/80  Pulse: 79  Weight: 227 lb 6.4 oz (103.1 kg)  Height: 5\' 5"  (1.651 m)  Body mass index is 37.84 kg/m.       Physical Examination:   General appearance: alert, well appearing, and in no distress  Psych: mood appropriate, normal affect  Skin: warm & dry   Cardiovascular: normal heart rate noted  Respiratory: normal respiratory effort, no distress  Abdomen: obese, soft, non-tender  Pelvic: VULVA: normal appearing vulva with no masses, tenderness or lesions, VAGINA: normal appearing vagina with normal color and discharge, no lesions, CERVIX: normal appearing cervix without discharge or lesions, UTERUS: enlarged mobile uterus, ADNEXA: normal adnexa in size, nontender and no masses  Extremities: no edema   Chaperone:  Freddie Apley     Endometrial Biopsy Procedure Note  Pre-operative Diagnosis: Postmenopausal bleeding, thickened endometrium  Post-operative Diagnosis: same  Procedure Details  The risks (including infection,  bleeding, pain, and uterine perforation) and benefits of the procedure were explained to the patient and Written informed consent was obtained.  Antibiotic prophylaxis against endocarditis was not indicated.   The patient was placed in the dorsal lithotomy position.  Bimanual exam showed the uterus to be in the neutral position.  A speculum  inserted in the vagina, and the cervix prepped with betadine.     A single tooth tenaculum was applied to the anterior lip of the cervix for stabilization.  Os finder was used.  A Pipelle endometrial aspirator was used to sample the endometrium.  Sample was sent for pathologic examination.  Condition: Stable  Complications: None   Assessment & Plan:  1) Postmenopausal bleeding, Intramural fibroids -Discussed potential causes of PMB ranging from benign findings to endometrial carcinoma -Reviewed ultrasound results and discussed further workup with EMB -After further discussion, patient thinks this started after the start of her Ozempic -EMB obtained as above, further management pending results of pathology -The patient was advised to call for any fever or for prolonged or severe pain or bleeding. She was advised to use OTC analgesics as needed for mild to moderate pain. She was advised to avoid vaginal intercourse for 48 hours or until the bleeding has completely stopped.   Return in about 1 year (around 09/24/2024) for Annual- due in May 2025.   [name], DO Attending Obstetrician & Gynecologist, [facility] for [employer], [facility]

## 2023-09-27 ENCOUNTER — Ambulatory Visit
Admission: RE | Admit: 2023-09-27 | Discharge: 2023-09-27 | Disposition: A | Discharge status: Home / Self Care | Payer: 59 | Source: Ambulatory Visit | Attending: Family Medicine | Admitting: Family Medicine

## 2023-09-27 DIAGNOSIS — Z1231 Encounter for screening mammogram for malignant neoplasm of breast: Secondary | ICD-10-CM

## 2023-09-27 LAB — SURGICAL PATHOLOGY

## 2023-10-29 ENCOUNTER — Encounter: Payer: Self-pay | Admitting: Family Medicine

## 2023-10-29 ENCOUNTER — Ambulatory Visit: Payer: 59 | Admitting: Family Medicine

## 2023-10-29 VITALS — BP 127/83 | HR 77 | Ht 65.0 in | Wt 228.0 lb

## 2023-10-29 DIAGNOSIS — E785 Hyperlipidemia, unspecified: Secondary | ICD-10-CM

## 2023-10-29 DIAGNOSIS — I10 Essential (primary) hypertension: Secondary | ICD-10-CM | POA: Diagnosis not present

## 2023-10-29 DIAGNOSIS — R198 Other specified symptoms and signs involving the digestive system and abdomen: Secondary | ICD-10-CM

## 2023-10-29 DIAGNOSIS — R42 Dizziness and giddiness: Secondary | ICD-10-CM

## 2023-10-29 MED ORDER — MECLIZINE HCL 50 MG PO TABS: ORAL_TABLET | ORAL | 0 refills | Status: AC

## 2023-10-29 NOTE — Patient Instructions (Addendum)
F/U in 4 months, call if you  need me sooner  You will be referred to dr Tobie Lords, re rectal pressure  Exercise program with healthy diet are a winning combo, you may also look at weight watchers for support  Report any additional bleeding to gyne, ultrasound and tests/ biopsy are all reassuring at this point   Medication is snt for dizziness  Weight loss medication may have been the trigger but you still need to watch tis   Thanks for choosing [facility], we consider it a privelige to serve you.  Best for 2025

## 2023-11-25 DIAGNOSIS — R42 Dizziness and giddiness: Secondary | ICD-10-CM | POA: Insufficient documentation

## 2023-11-25 DIAGNOSIS — R198 Other specified symptoms and signs involving the digestive system and abdomen: Secondary | ICD-10-CM | POA: Insufficient documentation

## 2023-11-25 NOTE — Progress Notes (Signed)
Diamond Le     MRN: [id number]      DOB: 1966-01-22  Chief Complaint  Patient presents with   Follow-up    Dizziness comes and goes throughout the day x 2 weeks, lab result    HPI Diamond Le is here for follow up and re-evaluation of chronic medical conditions, medication management and review of any available recent lab and radiology data.  Preventive health is updated, specifically  Cancer screening and Immunization.   Questions or concerns regarding consultations or procedures which the PT has had in the interim are  addressed. The PT denies any adverse reactions to current medications since the last visit.  Tc/o rectal pressure C/o difficulty with weight loss , has developed post menopausal bleeding on zepbound no interest in taking medication for weigt loss as a result of this had the whole workup which has been negative for malignancy C/o rectal pressure/ discomfort with difficulty emptying will refer to GI Recent episode of vertigo currnetly resolving ROS Denies recent fever or chills. Denies sinus pressure, nasal congestion, ear pain or sore throat. Denies chest congestion, productive cough or wheezing. Denies chest pains, palpitations and leg swelling Denies abdominal pain, nausea, vomiting,diarrhea or constipation.   Denies dysuria, frequency, hesitancy or incontinence. Denies joint pain, swelling and limitation in mobility. Denies headaches, seizures, numbness, or tingling. Denies depression, anxiety or insomnia. Denies skin break down or rash.   PE  BP 127/83 (BP Location: Right Arm, Patient Position: Sitting, Cuff Size: Large)   Pulse 77   Ht 5\' 5"  (1.651 m)   Wt 228 lb 0.6 oz (103.4 kg)   LMP 03/05/2021 (Approximate)   SpO2 96%   BMI 37.95 kg/m   Patient alert and oriented and in no cardiopulmonary distress.  HEENT: No facial asymmetry, EOMI,     Neck supple .  Chest: Clear to auscultation bilaterally.  CVS: S1, S2 no murmurs, no S3.Regular  rate.  ABD: Soft non tender.   Ext: No edema  MS: Adequate ROM spine, shoulders, hips and knees.  Skin: Intact, no ulcerations or rash noted.  Psych: Good eye contact, normal affect. Memory intact not anxious or depressed appearing.  CNS: CN 2-12 intact, power,  normal throughout.no focal deficits noted.   Assessment & Plan  Essential hypertension Controlled, no change in medication DASH diet and commitment to daily physical activity for a minimum of 30 minutes discussed and encouraged, as a part of hypertension management. The importance of attaining a healthy weight is also discussed.     10/29/2023    1:06 PM 09/25/2023   11:20 AM 09/10/2023    3:07 PM 08/29/2023   10:57 AM 04/04/2023    3:46 PM 12/20/2022    4:40 PM 12/20/2022    4:28 PM  BP/Weight  Systolic BP 127 132 120 116 134 140 142  Diastolic BP 83 80 75 77 82 90 86  Wt. (Lbs) 228.04 227.4 227 228.04 230    BMI 37.95 kg/m2 37.84 kg/m2 37.77 kg/m2 37.95 kg/m2 38.27 kg/m2         Dyslipidemia (high LDL; low HDL) Hyperlipidemia:Low fat diet discussed and encouraged.   Lipid Panel  Lab Results  Component Value Date   CHOL 184 09/16/2023   HDL 46 09/16/2023   LDLCALC 123 (H) 09/16/2023   TRIG 80 09/16/2023   CHOLHDL 4.0 09/16/2023     Needs to reduce fat intake ,  LDL is elevated  Morbid obesity due to excess calories [facility])  Patient re-educated  about  the importance of commitment to a  minimum of 150 minutes of exercise per week as able.  The importance of healthy food choices with portion control discussed, as well as eating regularly and within a 12 hour window most days. The need to choose "clean , green" food 50 to 75% of the time is discussed, as well as to make water the primary drink and set a goal of 64 ounces water daily.       10/29/2023    1:06 PM 09/25/2023   11:20 AM 09/10/2023    3:07 PM  Weight /BMI  Weight 228 lb 0.6 oz 227 lb 6.4 oz 227 lb  Height 5\' 5"  (1.651 m) 5\' 5"  (1.651 m)  5\' 5"  (1.651 m)  BMI 37.95 kg/m2 37.84 kg/m2 37.77 kg/m2    Unchanged in past 2 months, will work on lifestyle, is considering weight watchers  Rectal pressure GI to eval and manage  Vertigo Intermitent with recent episode, meclizine prescribed for as needed use

## 2023-11-25 NOTE — Assessment & Plan Note (Signed)
  Patient re-educated about  the importance of commitment to a  minimum of 150 minutes of exercise per week as able.  The importance of healthy food choices with portion control discussed, as well as eating regularly and within a 12 hour window most days. The need to choose "clean , green" food 50 to 75% of the time is discussed, as well as to make water the primary drink and set a goal of 64 ounces water daily.       10/29/2023    1:06 PM 09/25/2023   11:20 AM 09/10/2023    3:07 PM  Weight /BMI  Weight 228 lb 0.6 oz 227 lb 6.4 oz 227 lb  Height 5\' 5"  (1.651 m) 5\' 5"  (1.651 m) 5\' 5"  (1.651 m)  BMI 37.95 kg/m2 37.84 kg/m2 37.77 kg/m2    Unchanged in past 2 months, will work on lifestyle, is considering weight watchers

## 2023-11-25 NOTE — Assessment & Plan Note (Signed)
Intermitent with recent episode, meclizine prescribed for as needed use

## 2023-11-25 NOTE — Assessment & Plan Note (Signed)
Hyperlipidemia:Low fat diet discussed and encouraged.   Lipid Panel  Lab Results  Component Value Date   CHOL 184 09/16/2023   HDL 46 09/16/2023   LDLCALC 123 (H) 09/16/2023   TRIG 80 09/16/2023   CHOLHDL 4.0 09/16/2023     Needs to reduce fat intake ,  LDL is elevated

## 2023-11-25 NOTE — Assessment & Plan Note (Signed)
GI to eval and manage

## 2023-11-25 NOTE — Assessment & Plan Note (Signed)
Controlled, no change in medication DASH diet and commitment to daily physical activity for a minimum of 30 minutes discussed and encouraged, as a part of hypertension management. The importance of attaining a healthy weight is also discussed.     10/29/2023    1:06 PM 09/25/2023   11:20 AM 09/10/2023    3:07 PM 08/29/2023   10:57 AM 04/04/2023    3:46 PM 12/20/2022    4:40 PM 12/20/2022    4:28 PM  BP/Weight  Systolic BP 127 132 120 116 134 140 142  Diastolic BP 83 80 75 77 82 90 86  Wt. (Lbs) 228.04 227.4 227 228.04 230    BMI 37.95 kg/m2 37.84 kg/m2 37.77 kg/m2 37.95 kg/m2 38.27 kg/m2

## 2023-12-03 ENCOUNTER — Encounter (INDEPENDENT_AMBULATORY_CARE_PROVIDER_SITE_OTHER): Payer: Self-pay | Admitting: *Deleted

## 2023-12-26 ENCOUNTER — Encounter (INDEPENDENT_AMBULATORY_CARE_PROVIDER_SITE_OTHER): Payer: 59 | Admitting: Gastroenterology

## 2023-12-27 ENCOUNTER — Other Ambulatory Visit: Payer: Self-pay | Admitting: Family Medicine

## 2024-02-23 ENCOUNTER — Other Ambulatory Visit: Payer: Self-pay | Admitting: Family Medicine

## 2024-02-27 ENCOUNTER — Ambulatory Visit: Payer: 59 | Admitting: Family Medicine

## 2024-04-06 ENCOUNTER — Other Ambulatory Visit: Payer: Self-pay | Admitting: Family Medicine

## 2024-06-12 ENCOUNTER — Encounter (INDEPENDENT_AMBULATORY_CARE_PROVIDER_SITE_OTHER): Payer: Self-pay | Admitting: *Deleted

## 2024-06-18 ENCOUNTER — Encounter: Payer: Self-pay | Admitting: Advanced Practice Midwife

## 2024-06-18 ENCOUNTER — Other Ambulatory Visit (HOSPITAL_COMMUNITY)
Admission: RE | Admit: 2024-06-18 | Discharge: 2024-06-18 | Disposition: A | Discharge status: Home / Self Care | Source: Ambulatory Visit | Attending: Advanced Practice Midwife | Admitting: Advanced Practice Midwife

## 2024-06-18 ENCOUNTER — Ambulatory Visit: Admitting: Advanced Practice Midwife

## 2024-06-18 VITALS — BP 129/82 | HR 79 | Ht 65.0 in | Wt 231.0 lb

## 2024-06-18 DIAGNOSIS — N898 Other specified noninflammatory disorders of vagina: Secondary | ICD-10-CM | POA: Insufficient documentation

## 2024-06-18 DIAGNOSIS — Z01419 Encounter for gynecological examination (general) (routine) without abnormal findings: Secondary | ICD-10-CM | POA: Diagnosis not present

## 2024-06-18 MED ORDER — FLUCONAZOLE 150 MG PO TABS: ORAL_TABLET | ORAL | 4 refills | Status: AC

## 2024-06-18 MED ORDER — NITROFURANTOIN MONOHYD MACRO 100 MG PO CAPS: 100.0000 mg | ORAL_CAPSULE | Freq: Two times a day (BID) | ORAL | 0 refills | Status: AC

## 2024-06-18 NOTE — Progress Notes (Signed)
 Diamond Le Public 58 y.o.  Vitals:   06/18/24 1542  BP: 129/82  Pulse: 79     Filed Weights   06/18/24 1542  Weight: 231 lb (104.8 kg)    Past Medical History: Past Medical History:  Diagnosis Date   Anemia    heavy flow   Folliculitis 04/19/2015   GERD (gastroesophageal reflux disease)    Hypertension    Macromastia    Obesity    Palpitation    Superficial fungus infection of skin 04/19/2015   Vitamin D  deficiency     Past Surgical History: Past Surgical History:  Procedure Laterality Date   BREAST CYST EXCISION Right 12/14/2016   BREAST REDUCTION SURGERY Bilateral 02/21/2021   Procedure: MAMMARY REDUCTION  (BREAST);  Surgeon: Elisabeth Craig RAMAN, MD;  Location: [facility];  Service: Plastics;  Laterality: Bilateral;   COLONOSCOPY WITH PROPOFOL  N/A 06/27/2021   Procedure: COLONOSCOPY WITH PROPOFOL ;  Surgeon: Eartha Angelia Sieving, MD;  Location: [facility];  Service: Gastroenterology;  Laterality: N/A;  9:50   MASS EXCISION Right 12/14/2016   Procedure: EXCISION CYST RIGHT AXILLA;  Surgeon: Oneil Budge, MD;  Location: [facility];  Service: General;  Laterality: Right;   POLYPECTOMY  06/27/2021   Procedure: POLYPECTOMY;  Surgeon: Eartha Angelia Sieving, MD;  Location: [facility];  Service: Gastroenterology;;    Family History: Family History  Problem Relation Age of Onset   Asthma Mother        most of her life    Cancer Father        lung   Cancer Paternal Aunt        cervical    Hypertension Maternal Grandmother    Ovarian cancer Other    Ovarian cancer Niece     Social History: Social History   Tobacco Use   Smoking status: Never   Smokeless tobacco: Never  Vaping Use   Vaping status: Never Used  Substance Use Topics   Alcohol use: Not Currently    Comment: occ   Drug use: Never    Allergies:  Allergies  Allergen Reactions   Lisinopril  Cough      Current Outpatient Medications:    amLODipine  (NORVASC ) 10 MG tablet, TAKE  1 TABLET(10 MG) BY MOUTH DAILY, Disp: 30 tablet, Rfl: 5   clotrimazole -betamethasone  (LOTRISONE ) cream, Apply 1 application topically 2 (two) times daily., Disp: 45 g, Rfl: 1   fluconazole  (DIFLUCAN ) 150 MG tablet, 1 po stat; repeat in 3 days, Disp: 2 tablet, Rfl: 4   meclizine  (ANTIVERT ) 50 MG tablet, Take one half to one tablet by mouth three times daily as needed for dizziness, Disp: 30 tablet, Rfl: 0   nitrofurantoin , macrocrystal-monohydrate, (MACROBID ) 100 MG capsule, Take 1 capsule (100 mg total) by mouth 2 (two) times daily., Disp: 8 capsule, Rfl: 0   pantoprazole  (PROTONIX ) 20 MG tablet, TAKE 1 TABLET(20 MG) BY MOUTH DAILY, Disp: 90 tablet, Rfl: 1   spironolactone  (ALDACTONE ) 50 MG tablet, TAKE 1 TABLET(50 MG) BY MOUTH DAILY, Disp: 30 tablet, Rfl: 4   Vitamin D , Ergocalciferol , (DRISDOL ) 1.25 MG (50000 UNIT) CAPS capsule, Take 1 capsule (50,000 Units total) by mouth every 7 (seven) days., Disp: 5 capsule, Rfl: 8  History of Present Illness: Here for annual exam.  Dr. Antonetta is PCP and watches chronic issues and labs.  For months, has noticed a certain feeling in vagina when sitting a certain way--feels like pressure or something similar on right side of vagina.  Started taking leftover macrobid   a few days ago and feels like it's getting better.  Has chronic yeast in groin.  Went roller coaster riding a few weeks ago--afterwards, noticed a tiny speck of dark red blood X1.  Had W/U for PMB in October, some fibroids and neg bx.  Discussed w/Dr. Ozan.  If bleeding persists, will repeat w/u.   Prior cytology:      Component Value Date/Time   DIAGPAP  04/04/2023 1550    - Negative for intraepithelial lesion or malignancy (NILM)   DIAGPAP  12/10/2018 0000    NEGATIVE FOR INTRAEPITHELIAL LESIONS OR MALIGNANCY.   HPVHIGH Negative 04/04/2023 1550   ADEQPAP  04/04/2023 1550    Satisfactory for evaluation; transformation zone component PRESENT.   ADEQPAP  12/10/2018 0000    Satisfactory for  evaluation  endocervical/transformation zone component PRESENT.      Review of Systems   Patient denies any headaches, blurred vision, shortness of breath, chest pain, abdominal pain, problems with bowel movements, urination, or intercourse.   Physical Exam: General:  Well developed, well nourished, no acute distress Skin:  Warm and dry Neck:  Midline trachea Lungs; Clear to auscultation bilaterally Breast:  No dominant palpable mass, retraction, or nipple discharge Cardiovascular: Regular rate and rhythm Abdomen:  Soft, non tender, no hepatosplenomegaly Pelvic:  External genitalia is normal in appearance.  The vagina is normal in appearance. Some thick white DC. The cervix is bulbous.  Uterus is felt to be normal size, shape, and contour.  No adnexal masses or tenderness noted. Cannot appreciate any pelvic abnormalities Exam limited by habitus  Extremities:  No swelling or varicosities noted Psych:  No mood changes.    Chaperone: Engineer, maintenance, RN  Impression: Normal GYN exam \  Pap due 2027 Mammogram due in November   Will send in enough macrobid  for complete treatment; if sx don't continue to improve, will refer to pelvic floor PT  Swab sent to evaluate discharge  Rx diflucan  for groin yeast; could try barrier cream such as [place]

## 2024-06-22 LAB — CERVICOVAGINAL ANCILLARY ONLY
Bacterial Vaginitis (gardnerella): NEGATIVE
Candida Glabrata: NEGATIVE
Candida Vaginitis: NEGATIVE
Comment: NEGATIVE
Comment: NEGATIVE
Comment: NEGATIVE

## 2024-08-13 ENCOUNTER — Other Ambulatory Visit: Payer: Self-pay | Admitting: Family Medicine

## 2024-09-09 ENCOUNTER — Other Ambulatory Visit: Payer: Self-pay | Admitting: Family Medicine

## 2024-09-09 ENCOUNTER — Encounter (INDEPENDENT_AMBULATORY_CARE_PROVIDER_SITE_OTHER): Payer: Self-pay | Admitting: Gastroenterology

## 2024-09-10 ENCOUNTER — Other Ambulatory Visit: Payer: Self-pay | Admitting: Family Medicine

## 2024-09-10 NOTE — Telephone Encounter (Signed)
 Copied from CRM [id number]. Topic: Clinical - Medication Refill >> Sep 10, 2024  3:34 PM Charlet HERO wrote: Medication: spironolactone  (ALDACTONE ) 50 MG tablet  Has the patient contacted their pharmacy? Yes Says it is being denied by the provider  This is the patient's preferred pharmacy:  [id number] - [place], [place] - [address] AT SEC [address] [place] [address] Phone: [phone] Fax: [phone]  Is this the correct pharmacy for this prescription? Yes If no, delete pharmacy and type the correct one.   Has the prescription been filled recently? Yes  Is the patient out of the medication? Yes  Has the patient been seen for an appointment in the last year OR does the patient have an upcoming appointment? Yes  Can we respond through MyChart? Yes  Agent: Please be advised that Rx refills may take up to 3 business days. We ask that you follow-up with your pharmacy.

## 2024-09-15 ENCOUNTER — Ambulatory Visit: Payer: Self-pay | Admitting: Physician Assistant

## 2024-09-16 ENCOUNTER — Ambulatory Visit

## 2024-10-15 ENCOUNTER — Ambulatory Visit: Admitting: Internal Medicine

## 2024-10-23 ENCOUNTER — Other Ambulatory Visit: Payer: Self-pay | Admitting: Family Medicine

## 2024-11-25 ENCOUNTER — Other Ambulatory Visit: Payer: Self-pay | Admitting: Family Medicine

## 2024-11-25 MED ORDER — VITAMIN D (ERGOCALCIFEROL) 1.25 MG (50000 UNIT) PO CAPS: 50000.0000 [IU] | ORAL_CAPSULE | ORAL | 8 refills | Status: AC

## 2024-11-25 MED ORDER — AMLODIPINE BESYLATE 10 MG PO TABS: 10.0000 mg | ORAL_TABLET | Freq: Every day | ORAL | 5 refills | Status: AC

## 2024-11-25 NOTE — Telephone Encounter (Signed)
 Copied from CRM #[id number]. Topic: Clinical - Medication Refill >> Nov 25, 2024  9:57 AM Montie POUR wrote: Medication:  amLODipine  (NORVASC ) 10 MG tablet  Vitamin D , Ergocalciferol , (DRISDOL ) 1.25 MG (50000 UNIT) CAPS capsule  Has the patient contacted their pharmacy? Yes (Agent: If no, request that the patient contact the pharmacy for the refill. If patient does not wish to contact the pharmacy document the reason why and proceed with request.) (Agent: If yes, when and what did the pharmacy advise?) Pharmacy needs order to refill  This is the patient's preferred pharmacy:  [id number] - [place], [place] - [address] AT SEC [address] [place] [address] Phone: [phone] Fax: [phone]  Is this the correct pharmacy for this prescription? Yes If no, delete pharmacy and type the correct one.   Has the prescription been filled recently? No  Is the patient out of the medication? Yes - She has been out of medication since yesterday  Has the patient been seen for an appointment in the last year OR does the patient have an upcoming appointment? Yes - First available appointment is in March 2026 and I put her on a wait list   Can we respond through MyChart? Yes  Agent: Please be advised that Rx refills may take up to 3 business days. We ask that you follow-up with your pharmacy.

## 2024-12-11 ENCOUNTER — Encounter: Admitting: Family Medicine

## 2024-12-17 ENCOUNTER — Encounter: Payer: Self-pay | Admitting: Family Medicine

## 2024-12-18 ENCOUNTER — Ambulatory Visit: Admitting: Family Medicine

## 2024-12-18 VITALS — BP 125/82 | HR 82 | Resp 18 | Ht 66.0 in | Wt 239.0 lb

## 2024-12-18 DIAGNOSIS — D126 Benign neoplasm of colon, unspecified: Secondary | ICD-10-CM

## 2024-12-18 DIAGNOSIS — I10 Essential (primary) hypertension: Secondary | ICD-10-CM

## 2024-12-18 DIAGNOSIS — E559 Vitamin D deficiency, unspecified: Secondary | ICD-10-CM

## 2024-12-18 DIAGNOSIS — Z131 Encounter for screening for diabetes mellitus: Secondary | ICD-10-CM

## 2024-12-18 DIAGNOSIS — D508 Other iron deficiency anemias: Secondary | ICD-10-CM | POA: Diagnosis not present

## 2024-12-18 DIAGNOSIS — Z0001 Encounter for general adult medical examination with abnormal findings: Secondary | ICD-10-CM

## 2024-12-18 DIAGNOSIS — E785 Hyperlipidemia, unspecified: Secondary | ICD-10-CM | POA: Diagnosis not present

## 2024-12-18 MED ORDER — WEGOVY 1.5 MG PO TABS: 1.5000 mg | ORAL_TABLET | Freq: Every day | ORAL | 0 refills | Status: AC

## 2024-12-18 MED ORDER — PANTOPRAZOLE SODIUM 20 MG PO TBEC: 20.0000 mg | DELAYED_RELEASE_TABLET | Freq: Every day | ORAL | 1 refills | Status: AC

## 2024-12-18 NOTE — Patient Instructions (Addendum)
 F/U in 3 months  New medication for weight loss as discussed pls send message asap as to coverage  Text SAVE to [phone] to activate a virtual savings card for Wegovy   WATER only and for sweet , fruit fresh and frozen only , lets leave sweet tea and candy  It is important that you exercise regularly at least 30 minutes 5 times a week. If you develop chest pain, have severe difficulty breathing, or feel very tired, stop exercising immediately and seek medical attention       Please schedule mammogram at checkout, past due , [facility], soonest available  Nurse ps print July 2025 letter from GI so pt can complete paperwork and proceed

## 2024-12-18 NOTE — Progress Notes (Signed)
" ° ° °  Diamond Le     MRN: [id number]      DOB: [birth year]  Chief Complaint  Patient presents with   Annual Exam    Cpe     HPI: Patient is in for annual physical exam.  1 to 2 month h/o dark spots  on both feet and lower legs, very pruritic Recent labs,  are reviewed. Immunization is reviewed    PE: BP 125/82   Pulse 82   Resp 18   Ht 5' 6 (1.676 m)   Wt 239 lb 0.6 oz (108.4 kg)   LMP 03/05/2021   SpO2 94%   BMI 38.58 kg/m   Pleasant  female, alert and oriented x 3, in no cardio-pulmonary distress. Afebrile. HEENT No facial trauma or asymetry. Sinuses non tender.  Extra occullar muscles intact.. External ears normal, . Neck: supple, no adenopathy,JVD or thyromegaly.No bruits.  Chest: Clear to ascultation bilaterally.No crackles or wheezes. Non tender to palpation    Cardiovascular system; Heart sounds normal,  S1 and  S2 ,no S3.  No murmur, or thrill. Apical beat not displaced Peripheral pulses normal.  Abdomen: Soft, non tender .  Musculoskeletal exam: Full ROM of spine, hips , shoulders and knees. No deformity ,swelling or crepitus noted. No muscle wasting or atrophy.   Neurologic: Cranial nerves 2 to 12 intact. Power, tone ,sensation and reflexes normal throughout. No disturbance in gait. No tremor.  Skin: Intact, no ulceration, erythema , scaling or rash noted. Pigmentation normal throughout  Psych; Normal mood and affect. Judgement and concentration normal   Assessment & Plan:  Annual visit for general adult medical examination with abnormal findings Annual exam as documented. Counseling done  re healthy lifestyle involving commitment to 150 minutes exercise per week, heart healthy diet, and attaining healthy weight.The importance of adequate sleep also discussed. Regular seat belt use and home safety, is also discussed. Changes in health habits are decided on by the patient with goals and time frames  set for achieving  them. Immunization and cancer screening needs are specifically addressed at this visit.   Morbid obesity due to excess calories [facility])  Patient re-educated about  the importance of commitment to a  minimum of 150 minutes of exercise per week as able.  The importance of healthy food choices with portion control discussed, as well as eating regularly and within a 12 hour window most days. The need to choose clean , green food 50 to 75% of the time is discussed, as well as to make water the primary drink and set a goal of 64 ounces water daily.       12/18/2024    1:11 PM 06/18/2024    3:42 PM 10/29/2023    1:06 PM  Weight /BMI  Weight 239 lb 0.6 oz 231 lb 228 lb 0.6 oz  Height 5' 6 (1.676 m) 5' 5 (1.651 m) 5' 5 (1.651 m)  BMI 38.58 kg/m2 38.44 kg/m2 37.95 kg/m2    Start wegovy  tablet if covered and lifestyle change, zepbound ok if not covered  "

## 2024-12-18 NOTE — Assessment & Plan Note (Signed)

## 2024-12-18 NOTE — Assessment & Plan Note (Signed)
" °  Patient re-educated about  the importance of commitment to a  minimum of 150 minutes of exercise per week as able.  The importance of healthy food choices with portion control discussed, as well as eating regularly and within a 12 hour window most days. The need to choose clean , green food 50 to 75% of the time is discussed, as well as to make water the primary drink and set a goal of 64 ounces water daily.       12/18/2024    1:11 PM 06/18/2024    3:42 PM 10/29/2023    1:06 PM  Weight /BMI  Weight 239 lb 0.6 oz 231 lb 228 lb 0.6 oz  Height 5' 6 (1.676 m) 5' 5 (1.651 m) 5' 5 (1.651 m)  BMI 38.58 kg/m2 38.44 kg/m2 37.95 kg/m2    Start wegovy  tablet if covered and lifestyle change, zepbound ok if not covered "

## 2024-12-19 ENCOUNTER — Ambulatory Visit: Payer: Self-pay | Admitting: Family Medicine

## 2024-12-19 LAB — CMP14+EGFR
ALT: 18 [IU]/L (ref 0–32)
AST: 19 [IU]/L (ref 0–40)
Albumin: 4.5 g/dL (ref 3.8–4.9)
Alkaline Phosphatase: 116 [IU]/L (ref 49–135)
BUN/Creatinine Ratio: 12 (ref 9–23)
BUN: 13 mg/dL (ref 6–24)
Bilirubin Total: 0.4 mg/dL (ref 0.0–1.2)
CO2: 24 mmol/L (ref 20–29)
Calcium: 9.9 mg/dL (ref 8.7–10.2)
Chloride: 104 mmol/L (ref 96–106)
Creatinine, Ser: 1.05 mg/dL — ABNORMAL HIGH (ref 0.57–1.00)
Globulin, Total: 2.7 g/dL (ref 1.5–4.5)
Glucose: 86 mg/dL (ref 70–99)
Potassium: 4.4 mmol/L (ref 3.5–5.2)
Sodium: 144 mmol/L (ref 134–144)
Total Protein: 7.2 g/dL (ref 6.0–8.5)
eGFR: 62 mL/min/{1.73_m2}

## 2024-12-19 LAB — CBC WITH DIFFERENTIAL/PLATELET
Basophils Absolute: 0 10*3/uL (ref 0.0–0.2)
Basos: 0 %
EOS (ABSOLUTE): 0.2 10*3/uL (ref 0.0–0.4)
Eos: 2 %
Hematocrit: 39.8 % (ref 34.0–46.6)
Hemoglobin: 12.7 g/dL (ref 11.1–15.9)
Immature Grans (Abs): 0 10*3/uL (ref 0.0–0.1)
Immature Granulocytes: 0 %
Lymphocytes Absolute: 1.9 10*3/uL (ref 0.7–3.1)
Lymphs: 29 %
MCH: 28 pg (ref 26.6–33.0)
MCHC: 31.9 g/dL (ref 31.5–35.7)
MCV: 88 fL (ref 79–97)
Monocytes Absolute: 0.4 10*3/uL (ref 0.1–0.9)
Monocytes: 6 %
Neutrophils Absolute: 4 10*3/uL (ref 1.4–7.0)
Neutrophils: 63 %
Platelets: 322 10*3/uL (ref 150–450)
RBC: 4.53 x10E6/uL (ref 3.77–5.28)
RDW: 11.6 % — ABNORMAL LOW (ref 11.7–15.4)
WBC: 6.4 10*3/uL (ref 3.4–10.8)

## 2024-12-19 LAB — LIPID PANEL
Chol/HDL Ratio: 3.3 ratio (ref 0.0–4.4)
Cholesterol, Total: 194 mg/dL (ref 100–199)
HDL: 59 mg/dL
LDL Chol Calc (NIH): 122 mg/dL — ABNORMAL HIGH (ref 0–99)
Triglycerides: 70 mg/dL (ref 0–149)
VLDL Cholesterol Cal: 13 mg/dL (ref 5–40)

## 2024-12-19 LAB — HEMOGLOBIN A1C
Est. average glucose Bld gHb Est-mCnc: 105 mg/dL
Hgb A1c MFr Bld: 5.3 % (ref 4.8–5.6)

## 2024-12-19 LAB — TSH: TSH: 1.59 u[IU]/mL (ref 0.450–4.500)

## 2024-12-19 LAB — VITAMIN D 25 HYDROXY (VIT D DEFICIENCY, FRACTURES): Vit D, 25-Hydroxy: 49.4 ng/mL (ref 30.0–100.0)

## 2024-12-23 ENCOUNTER — Other Ambulatory Visit: Payer: Self-pay | Admitting: Family Medicine

## 2024-12-23 DIAGNOSIS — Z1231 Encounter for screening mammogram for malignant neoplasm of breast: Secondary | ICD-10-CM

## 2024-12-24 ENCOUNTER — Other Ambulatory Visit (HOSPITAL_COMMUNITY): Payer: Self-pay

## 2024-12-25 ENCOUNTER — Other Ambulatory Visit (HOSPITAL_COMMUNITY): Payer: Self-pay

## 2024-12-30 ENCOUNTER — Other Ambulatory Visit (HOSPITAL_COMMUNITY): Payer: Self-pay

## 2024-12-31 ENCOUNTER — Encounter: Payer: Self-pay | Admitting: Family Medicine

## 2025-01-04 ENCOUNTER — Ambulatory Visit (INDEPENDENT_AMBULATORY_CARE_PROVIDER_SITE_OTHER): Admitting: Gastroenterology

## 2025-01-08 ENCOUNTER — Ambulatory Visit

## 2025-01-25 ENCOUNTER — Ambulatory Visit (INDEPENDENT_AMBULATORY_CARE_PROVIDER_SITE_OTHER): Admitting: Gastroenterology

## 2025-01-29 ENCOUNTER — Encounter: Payer: Self-pay | Admitting: Family Medicine

## 2025-02-01 ENCOUNTER — Ambulatory Visit (INDEPENDENT_AMBULATORY_CARE_PROVIDER_SITE_OTHER): Admitting: Gastroenterology

## 2025-03-26 ENCOUNTER — Ambulatory Visit: Payer: Self-pay | Admitting: Family Medicine
